# Patient Record
Sex: Male | Born: 1935 | Race: White | Hispanic: No | Marital: Married | Smoking: Former smoker
Health system: Southern US, Community
[De-identification: ages and names within clinical notes are randomized; demographics above are authoritative.]

## PROBLEM LIST (undated history)

## (undated) DIAGNOSIS — R413 Other amnesia: Secondary | ICD-10-CM

## (undated) DIAGNOSIS — R269 Unspecified abnormalities of gait and mobility: Secondary | ICD-10-CM

## (undated) DIAGNOSIS — J189 Pneumonia, unspecified organism: Secondary | ICD-10-CM

## (undated) DIAGNOSIS — R251 Tremor, unspecified: Secondary | ICD-10-CM

## (undated) DIAGNOSIS — G252 Other specified forms of tremor: Secondary | ICD-10-CM

## (undated) DIAGNOSIS — E7800 Pure hypercholesterolemia, unspecified: Secondary | ICD-10-CM

## (undated) DIAGNOSIS — R6889 Other general symptoms and signs: Secondary | ICD-10-CM

## (undated) DIAGNOSIS — H9193 Unspecified hearing loss, bilateral: Secondary | ICD-10-CM

## (undated) DIAGNOSIS — N400 Enlarged prostate without lower urinary tract symptoms: Secondary | ICD-10-CM

## (undated) DIAGNOSIS — I10 Essential (primary) hypertension: Secondary | ICD-10-CM

## (undated) DIAGNOSIS — K5790 Diverticulosis of intestine, part unspecified, without perforation or abscess without bleeding: Secondary | ICD-10-CM

## (undated) DIAGNOSIS — IMO0001 Reserved for inherently not codable concepts without codable children: Secondary | ICD-10-CM

## (undated) DIAGNOSIS — G250 Essential tremor: Secondary | ICD-10-CM

## (undated) DIAGNOSIS — K219 Gastro-esophageal reflux disease without esophagitis: Secondary | ICD-10-CM

## (undated) DIAGNOSIS — D518 Other vitamin B12 deficiency anemias: Secondary | ICD-10-CM

## (undated) DIAGNOSIS — H3530 Unspecified macular degeneration: Secondary | ICD-10-CM

## (undated) HISTORY — DX: Gastro-esophageal reflux disease without esophagitis: K21.9

## (undated) HISTORY — DX: Unspecified abnormalities of gait and mobility: R26.9

## (undated) HISTORY — PX: EYE SURGERY: SHX253

## (undated) HISTORY — PX: TONSILLECTOMY: SUR1361

## (undated) HISTORY — DX: Other specified forms of tremor: G25.0

## (undated) HISTORY — DX: Unspecified macular degeneration: H35.30

## (undated) HISTORY — DX: Diverticulosis of intestine, part unspecified, without perforation or abscess without bleeding: K57.90

## (undated) HISTORY — DX: Reserved for inherently not codable concepts without codable children: IMO0001

## (undated) HISTORY — DX: Other vitamin B12 deficiency anemias: D51.8

## (undated) HISTORY — DX: Benign prostatic hyperplasia without lower urinary tract symptoms: N40.0

## (undated) HISTORY — DX: Other amnesia: R41.3

## (undated) HISTORY — PX: LEG SURGERY: SHX1003

## (undated) HISTORY — DX: Other general symptoms and signs: R68.89

## (undated) HISTORY — DX: Tremor, unspecified: R25.1

## (undated) HISTORY — DX: Essential tremor: G25.2

## (undated) HISTORY — DX: Unspecified hearing loss, bilateral: H91.93

---

## 2002-09-14 ENCOUNTER — Ambulatory Visit (HOSPITAL_COMMUNITY): Admission: RE | Admit: 2002-09-14 | Discharge: 2002-09-14 | Payer: Self-pay | Admitting: Internal Medicine

## 2004-07-06 ENCOUNTER — Ambulatory Visit (HOSPITAL_COMMUNITY): Admission: RE | Admit: 2004-07-06 | Discharge: 2004-07-06 | Payer: Self-pay | Admitting: Colon and Rectal Surgery

## 2006-08-30 ENCOUNTER — Ambulatory Visit: Payer: Self-pay | Admitting: Ophthalmology

## 2006-09-06 ENCOUNTER — Ambulatory Visit: Payer: Self-pay | Admitting: Ophthalmology

## 2008-02-01 ENCOUNTER — Inpatient Hospital Stay (HOSPITAL_COMMUNITY): Admission: AD | Admit: 2008-02-01 | Discharge: 2008-02-05 | Payer: Self-pay | Admitting: Family Medicine

## 2008-02-14 ENCOUNTER — Encounter: Payer: Self-pay | Admitting: Internal Medicine

## 2008-03-05 ENCOUNTER — Encounter: Payer: Self-pay | Admitting: Internal Medicine

## 2008-03-06 ENCOUNTER — Ambulatory Visit (HOSPITAL_COMMUNITY): Admission: RE | Admit: 2008-03-06 | Discharge: 2008-03-06 | Payer: Self-pay | Admitting: Family Medicine

## 2008-03-13 ENCOUNTER — Encounter: Payer: Self-pay | Admitting: Internal Medicine

## 2008-03-28 ENCOUNTER — Ambulatory Visit (HOSPITAL_COMMUNITY): Admission: RE | Admit: 2008-03-28 | Discharge: 2008-03-28 | Payer: Self-pay | Admitting: Gastroenterology

## 2008-03-28 ENCOUNTER — Encounter: Payer: Self-pay | Admitting: Gastroenterology

## 2008-04-02 ENCOUNTER — Ambulatory Visit: Payer: Self-pay | Admitting: Gastroenterology

## 2008-07-28 ENCOUNTER — Emergency Department (HOSPITAL_COMMUNITY): Admission: EM | Admit: 2008-07-28 | Discharge: 2008-07-28 | Payer: Self-pay | Admitting: Emergency Medicine

## 2009-09-01 ENCOUNTER — Encounter: Admission: RE | Admit: 2009-09-01 | Discharge: 2009-09-01 | Payer: Self-pay | Admitting: Neurology

## 2009-09-14 IMAGING — CT CT ABDOMEN WO/W CM
2 of 4 series · 14 of 32 positions shown, 19 images · IV contrast (agent unspecified)
Comparison: 02/02/2008 abdominal CT.

CLINICAL DATA: Follow up pancreatitis.  Diabetes.

CT ABDOMEN WITHOUT AND WITH CONTRAST
TECHNIQUE: Multidetector CT imaging of the abdomen was performed
following the standard protocol before and during bolus
administration of intravenous contrast.
Contrast: 100 ml Imnipaque-3LL.  Oral contrast was given.

[Series 2: arterial 20 sec 3.0 b40f · axial · arterial · 0.71mm/px · z∈[-220,-16]mm · 7 of 92 slices shown]
[im 12/92  soft-tissue]
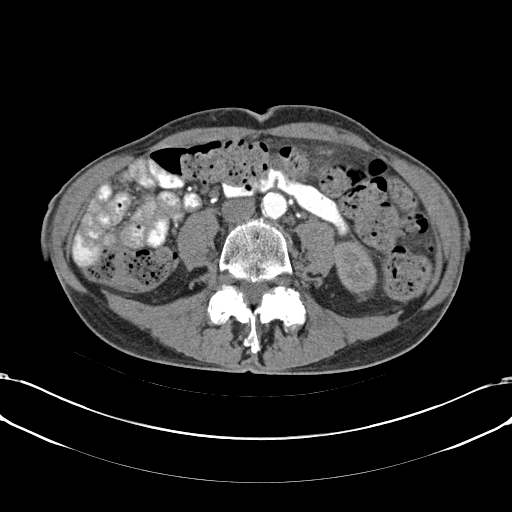
[im 23/92  soft-tissue]
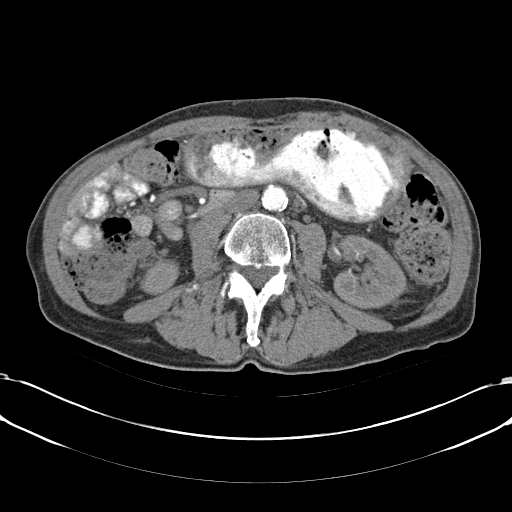
[im 35/92  soft-tissue]
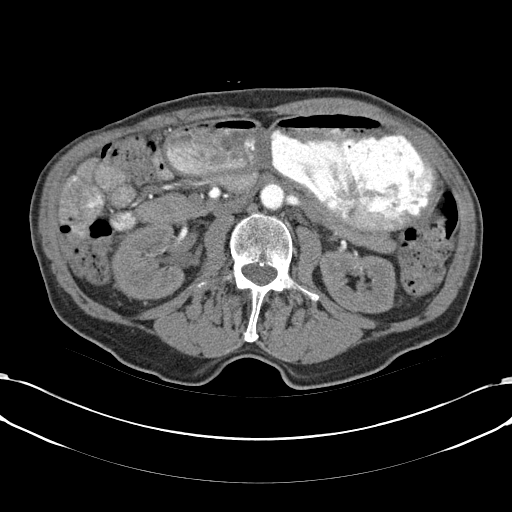
[im 46/92  soft-tissue]
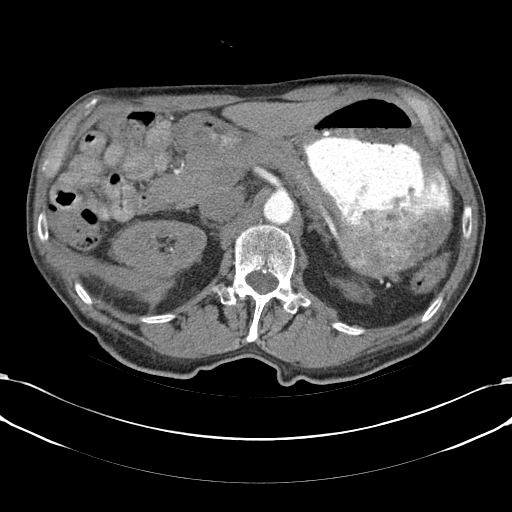
[im 57/92  soft-tissue]
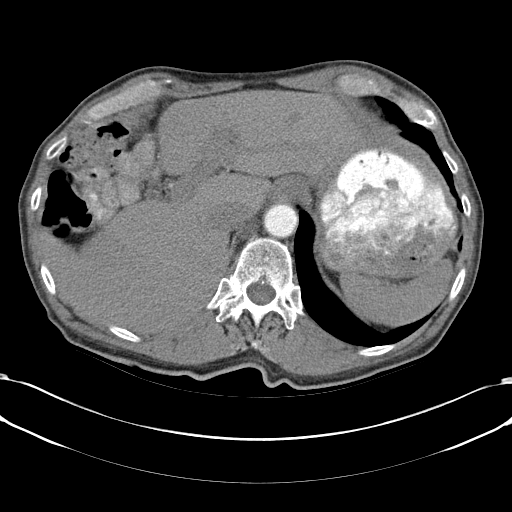
[im 69/92  soft-tissue]
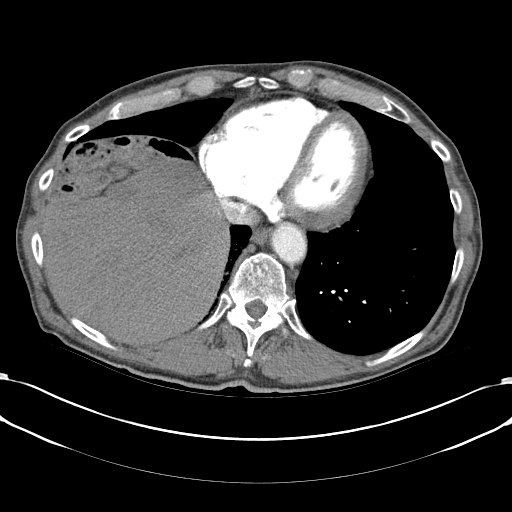
[im 80/92  soft-tissue]
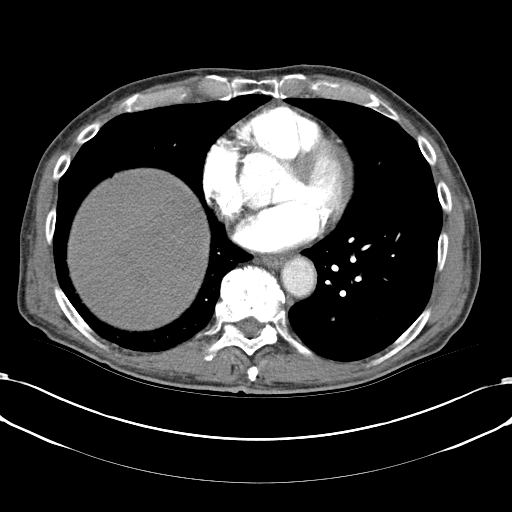

[Series 3: venous 60 sec 3.0 b40f · axial · portal-venous · 0.71mm/px · z∈[-220,-16]mm · 7 of 92 slices shown, 12 images]
[im 12/92  soft-tissue]
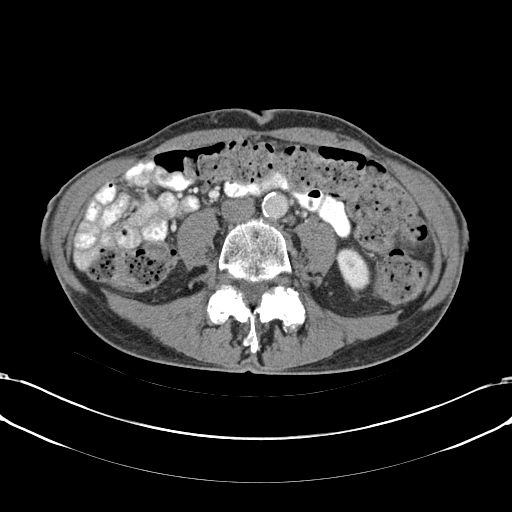
[im 12/92  bone]
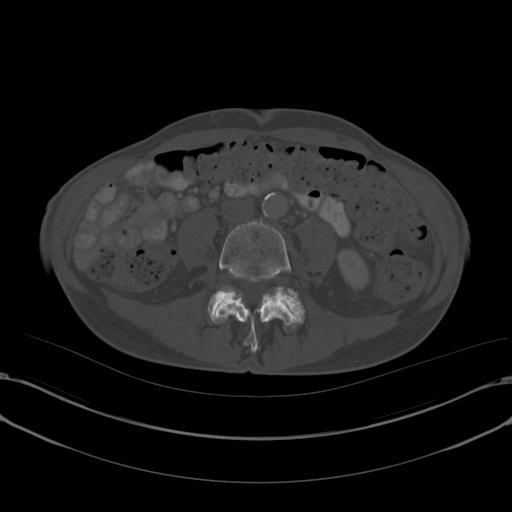
[im 23/92  soft-tissue]
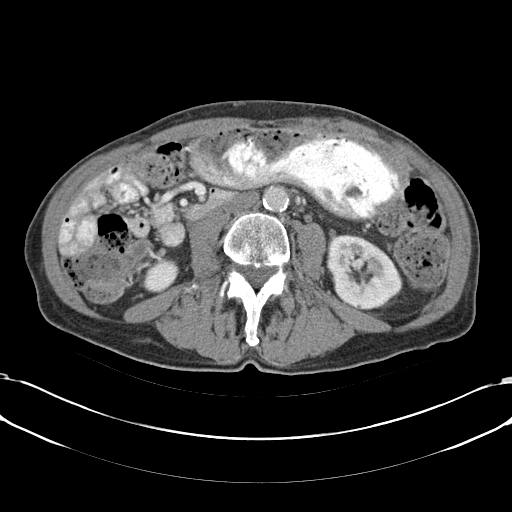
[im 35/92  soft-tissue]
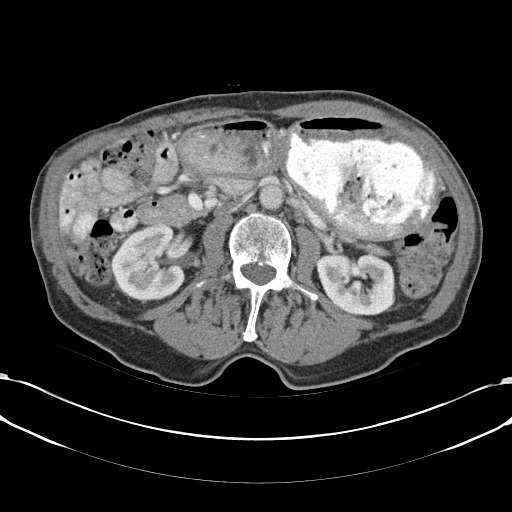
[im 46/92  soft-tissue]
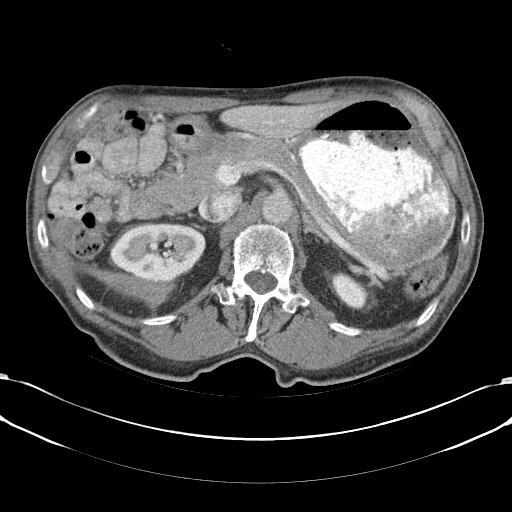
[im 46/92  lung]
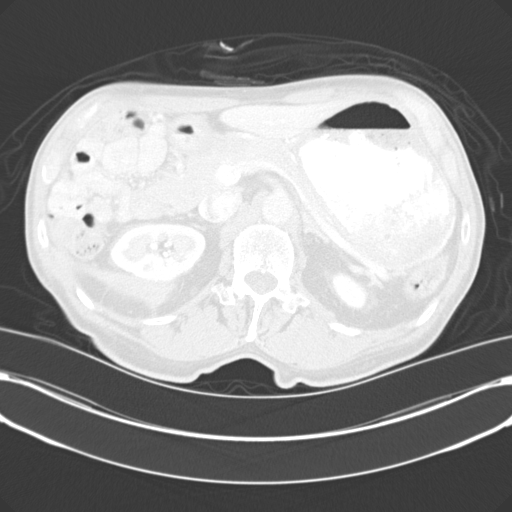
[im 57/92  soft-tissue]
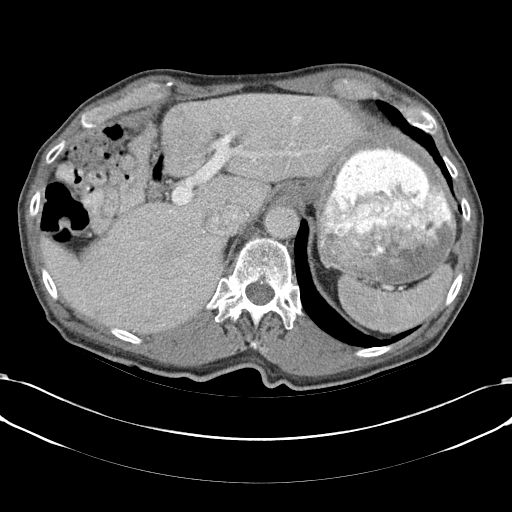
[im 57/92  lung]
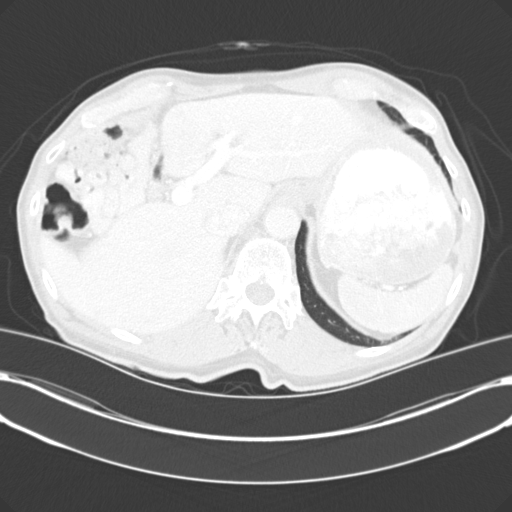
[im 69/92  soft-tissue]
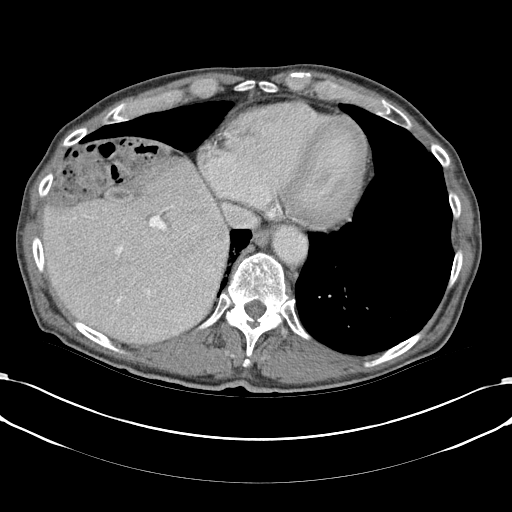
[im 69/92  lung]
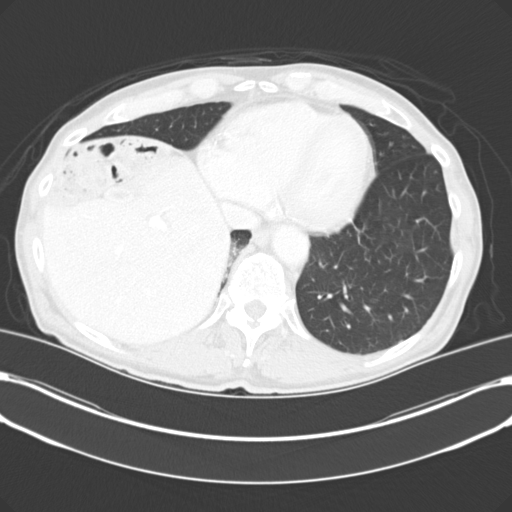
[im 80/92  soft-tissue]
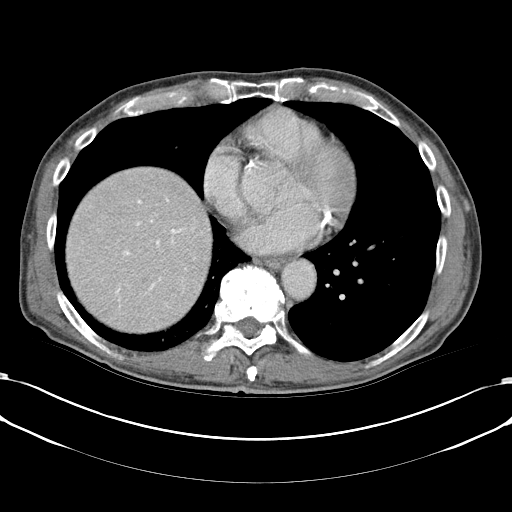
[im 80/92  lung]
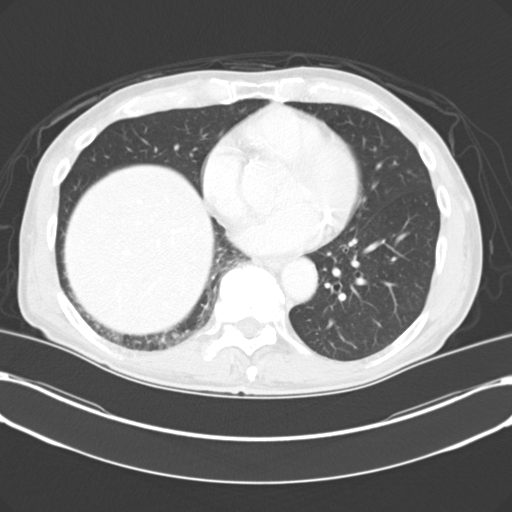

[14 of 32 positions shown; findings below may reference images not displayed]

FINDINGS: The lung bases are clear and there is no pleural
effusion.  Coronary artery calcifications are present.  As noted
previously, there is elevation or eventration of the right
hemidiaphragm with colonic interposition between the liver and the
diaphragm anteriorly.  There is mild stranding of the perinephric
fat bilaterally.  This appears to be in part due to a remnant of
the diaphragm base on the reformatted images.  No residual
peripancreatic inflammation is seen.  The pancreas demonstrates
normal enhancement and no focal abnormality.  There is no fluid
collection.  The splenic and portal veins remain patent.

The kidneys appear stable with a cyst in the lower pole of the
right kidney.  The spleen and adrenal glands appear unremarkable.
No focal liver lesions are identified.  There are no enlarged lymph
nodes. Small anteriorly positioned gallbladder demonstrates no
definite inflammation.
IMPRESSION: 1.  No acute abdominal findings.
2.  Suspected pancreatitis on the prior examination appears to have
resolved.  No abdominal fluid collections are seen.
3.  Stable right renal cyst and elevation or eventration of the
right hemidiaphragm.

## 2010-04-02 ENCOUNTER — Encounter (HOSPITAL_COMMUNITY): Admission: RE | Admit: 2010-04-02 | Discharge: 2010-05-02 | Payer: Self-pay | Admitting: Family Medicine

## 2010-11-29 ENCOUNTER — Encounter: Payer: Self-pay | Admitting: Family Medicine

## 2011-03-17 ENCOUNTER — Other Ambulatory Visit: Payer: Self-pay | Admitting: Family Medicine

## 2011-03-17 DIAGNOSIS — R42 Dizziness and giddiness: Secondary | ICD-10-CM

## 2011-03-19 ENCOUNTER — Ambulatory Visit (HOSPITAL_COMMUNITY)
Admission: RE | Admit: 2011-03-19 | Discharge: 2011-03-19 | Disposition: A | Discharge status: Home / Self Care | Payer: Medicare Other | Source: Ambulatory Visit | Attending: Family Medicine | Admitting: Family Medicine

## 2011-03-19 DIAGNOSIS — R42 Dizziness and giddiness: Secondary | ICD-10-CM | POA: Insufficient documentation

## 2011-03-23 NOTE — Group Therapy Note (Signed)
NAME:  Bradley Reed, Bradley Reed NO.:  [id number]   MEDICAL RECORD NO.:  [id number]          PATIENT TYPE:  INP   LOCATION:  A314                          FACILITY:  [facility]   PHYSICIAN:  Gardiner Barefoot, MD    DATE OF BIRTH:  1924/08/28   DATE OF PROCEDURE:  DATE OF DISCHARGE:                                 PROGRESS NOTE   SUBJECTIVE:  No events overnight and the patient continues to have  improvement and is actually anxious to get out of bed and walk around.  He tolerated clear liquid diet well with no difficulties.   OBJECTIVE:  VITAL SIGNS:  Temperature is 97.5, pulse 62, respirations  20, blood pressure 126/77.  GENERAL:  The patient is awake, alert and oriented x3 and appears in no  acute distress.  CARDIOVASCULAR:  Regular rate and rhythm with systolic ejection murmur.  LUNGS:  Clear to auscultation bilaterally.  ABDOMEN:  Soft, nontender, nondistended and positive bowel sounds and no  hepatosplenomegaly.  EXTREMITIES:  No cyanosis, clubbing or edema.   LABORATORY DATA:  From yesterday include lipase of 57 with mildly  decreased sodium and glucose of 64 otherwise normal, amylase elevated a  little at 166.   ASSESSMENT/PLAN:  1. Pancreatitis.  The patient will have his diet advanced further      today to carb modified and have his IV fluids stopped.  If the      patient does not tolerate any of his food or begins to have any      nausea, vomiting will restart his IV fluids.  Patient is to get up      out of bed, walk around.  Will again evaluate his triglyceride      levels in the a.m. tomorrow.  2. Constipation. The patient is receiving Senokot and is hopeful with      walking around and advancing his diet will be able to relieve his      constipation.      Gardiner Barefoot, MD  Electronically Signed     RWC/MEDQ  D:  02/04/2008  T:  02/05/2008  Job:  [id number]

## 2011-03-23 NOTE — Group Therapy Note (Signed)
NAME:  Bradley Reed, Bradley Reed NO.:  [id number]   MEDICAL RECORD NO.:  [id number]          PATIENT TYPE:  INP   LOCATION:  A314                          FACILITY:  [facility]   PHYSICIAN:  Gardiner Barefoot, MD    DATE OF BIRTH:  12-14-1923   DATE OF PROCEDURE:  02/03/2008  DATE OF DISCHARGE:                                 PROGRESS NOTE   SUBJECTIVE:  Overnight no acute events.  The patient does report that he  feels much better.  The patient has tolerated sips of water and has  had no fever or any other difficulties.   OBJECTIVE:  VITAL SIGNS:  Temperature 97.5, pulse 74, respirations 20,  blood pressure 147/83, and O2 saturations 98%.  GENERAL:  The patient is awake, alert, and oriented x3 and appears in no  acute distress.  CARDIOVASCULAR:  Regular rate and rhythm with a 3/6 systolic ejection  murmur.  LUNGS:  Are clear to auscultation bilaterally.  ABDOMEN:  Is soft, nontender and nondistended.  Positive bowel sounds  and no hepatosplenomegaly.  EXTREMITIES:  No cyanosis, clubbing, or edema.   LABORATORY DATA:  No new labs this a.m.   ASSESSMENT/PLAN:  Pancreatitis.  Unclear etiology of his pancreatitis.  CT and ultrasound do not suggest any and gallbladder etiology.  The  patient does report daily alcohol use.  However, from what he describes  fairly minimal amount.  This would unlikely be the precipitant for this  pancreatitis.  However, certainly the patient was counseled to stop  drinking at this time since he has had this episode, so it does not  exacerbate it.  Other etiologies would be triglycerides, which I do not  see at this time any recent triglycerides.  However, he is on a statin  to suggest some elevation of cholesterol.  Will check his fasting lipids  to rule out the triglycerides as the etiology.  Otherwise will advance  his diet today to clear liquids and see if he tolerates that and did  discuss the patient to go slowly.  Will continue the IV fluids  for now  while he does advance the diet.  CT scan does not suggest any need for  antibiotics.  Will follow his white count.      Gardiner Barefoot, MD  Electronically Signed     RWC/MEDQ  D:  02/03/2008  T:  02/03/2008  Job:  [id number]

## 2011-03-23 NOTE — H&P (Signed)
NAME:  Bradley Reed, Bradley Reed             ACCOUNT NO.:  [id number]   MEDICAL RECORD NO.:  [id number]          PATIENT TYPE:  INP   LOCATION:  A314                          FACILITY:  [facility]   PHYSICIAN:  Scott A. Gerda Diss, MD    DATE OF BIRTH:  12-14-1923   DATE OF ADMISSION:  02/01/2008  DATE OF DISCHARGE:  LH                              HISTORY & PHYSICAL   CHIEF COMPLAINT:  Abdominal pain.   HISTORY OF PRESENT ILLNESS:  This is an 77 year old white male who  related having right upper quadrant discomfort that started about 3 days  ago, intermittent pain.  The pain comes and goes, sometimes in the  flank and sometimes in the abdomen. He could not really tell if it was  rib or a pulled  muscle.  He really does not think it is a pulled muscle  though because the pain can be the same whether he is  doing something  or staying still.  It is bad enough to where it kept him awake during  the night.  He relates nausea with them but no vomiting.  He denies  diarrhea.  Denies fever, chills, sweats, denies chest tightness,  shortness of breath, cough, sore throat, headache.   PAST MEDICAL HISTORY:  1. Enlarged prostate.  2. Diverticulosis.  3. Type 2 diabetes.  4. Glaucoma.  5. Hyperlipidemia.  6. Hypertension.  7. Macular degeneration disease.  8. Reflux.  9. Hearing impairment.  10.History of fractured leg.   FAMILY HISTORY:  Hypertension, heart disease and cholesterol.   SOCIAL HISTORY:  Married.  Does not smoke.   MEDICATIONS:  1. Pravachol 80 mg daily.  2. Prinivil 20 mg daily.  3. Vitamins daily.  4. Baby aspirin 81 mg daily.  5. Lumigan drops in the eyes daily.  6. Glucophage 1000 mg b.i.d. daily.  7. Vitamin B12 p.o. daily.  8. Prilosec OTC daily.  9. Glucosamine b.i.d. p.r.n.  10.Eye supplement daily.  11.MiraLax on a p.r.n. basis.   REVIEW OF SYSTEMS:  See per above.   PHYSICAL EXAMINATION:  GENERAL:  He is pleasant but he does state that  he is having some  discomfort.  VITAL SIGNS:  Blood pressure 142/84.  TMs __________.  NECK:  No masses.  CHEST:  CTA.  No crackles.  RNL.  HEART:  Regular.  ABDOMEN:  Abdomen is soft.  No guarding or rebound.  It should be noted  when percussing of right flank, he did have some tenderness.  It should  also be noted that when pressing deep in the right upper quadrant, there  was some mild tenderness.  Minimal epigastric tenderness should also be  noted.  EXTREMITIES:  No edema.  SKIN: Warm, dry.  RECTAL:  Prostate exam was normal, nontender.   LABORATORY DATA:  Also labs were done as an outpatient and those showed  a normal white count of 7000, hemoglobin 14.2.  Glucose 103, potassium  4, sodium 136, bilirubin 1.1, AST 23, ALT 14 and an amylase of 233.   ASSESSMENT/PLAN:  Pancreatitis.  There is no obvious underlying risk  factors for this  pancreatitis. It seems to have come out of the blue.  I  think we need to get an ultrasound  to look at the possibility of a  gallstone, to look at the common bile duct and, hopefully, the  pancreatic head as well.  Repeat labs in the morning.  If he is still  having discomfort and the ultrasound is negative, consider doing a CT  scan.  If he has ongoing troubles, consider a HIDA scan.  I do not feel  an EGD is indicated right at the moment.  I do not feel antibiotics are  indicated currently.  We will monitor the patient closely and follow up  in the morning.      Scott A. Gerda Diss, MD  Electronically Signed     SAL/MEDQ  D:  02/01/2008  T:  02/02/2008  Job:  [id number]

## 2011-03-23 NOTE — Discharge Summary (Signed)
NAME:  Bradley Reed, Bradley Reed NO.:  [id number]   MEDICAL RECORD NO.:  [id number]          PATIENT TYPE:  INP   LOCATION:  A314                          FACILITY:  [facility]   PHYSICIAN:  Donna Bernard, M.D.DATE OF BIRTH:  [birth year]   DATE OF ADMISSION:  02/01/2008  DATE OF DISCHARGE:  03/30/2009LH                               DISCHARGE SUMMARY   FINAL DIAGNOSES:  1. Pancreatitis.  2. Type 2 diabetes.  3. Hyperlipidemia.  4. Hypertension.  5. Insomnia.   FINAL DISPOSITION:  1. The patient discharged to home.  2. Discharge medications:  Maintain all chronic meds plus Vicodin 1      q.4-6 hours p.r.n. for pain, Reglan 5 mg q.4-6 p.r.n. for nausea.  3. Low-fat diet this next week.  4. Report any worsening in the abdominal pain or occurrence of      vomiting.  5. Follow up in the office as scheduled.   INITIAL HISTORY AND PHYSICAL:  Please see H&P as dictated.   HOSPITAL COURSE:  This patient is an 77 year old white male with a  longstanding history of type 2 diabetes, hyperlipidemia, and  hypertension who presented to the office with a several-day history of  abdominal pain, primarily in the epigastric right upper quadrant area  radiating to the flank.  At times, it was severe and it was associated  with nausea.  There was no vomiting.  Abdominal exam at that point  revealed some tenderness in the right upper quadrant.  Amylase was 233.  Liver enzymes were normal.  The patient was admitted to the hospital,  started on IV fluids, IV pain medicine, and Zofran.  Ultrasound was  normal, and CT of the pancreas showed some swelling in the head of the  pancreas.  The patient's amylase and lipase dropped down to normal on IV  fluids.  His symptoms improved markedly.  His diet was advanced a day  prior to discharge, and he handled this well.  On the day of discharge,  the patient had benign exam and was discharged home.   DIAGNOSIS AND DISPOSITION:  As noted  above.      Donna Bernard, M.D.  Electronically Signed    WSL/MEDQ  D:  03/06/2008  T:  03/07/2008  Job:  [id number]

## 2011-03-26 NOTE — Op Note (Signed)
NAME:  Bradley Reed, Bradley Reed                       ACCOUNT NO.:  [id number]   MEDICAL RECORD NO.:  [id number]                   PATIENT TYPE:  AMB   LOCATION:  DAY                                  FACILITY:  [facility]   PHYSICIAN:  R. Roetta Sessions, M.D.              DATE OF BIRTH:  1923/11/20   DATE OF PROCEDURE:  09/14/2002  DATE OF DISCHARGE:                                 OPERATIVE REPORT   PROCEDURE:  Screening colonoscopy.   INDICATION FOR PROCEDURE:  The patient is a 77 year old gentleman sent over  out of the courtesy of W. Simone Curia, M.D., for colorectal cancer  screening.  He is devoid of any lower GI tract symptoms.  He has had  sigmoidoscopies in the past.  The last was several years ago, and no  significant findings were noted.  Colonoscopy is now being done as a  standard screening maneuver.  There is no significant family history of  colorectal neoplasia.  ASA-2.   DESCRIPTION OF PROCEDURE:  O2 saturation, blood pressure, pulse, and  respirations were monitored throughout the entire procedure.  Conscious  sedation:  Versed 3 mg IV, Demerol 50 mg IV in divided doses.  Instrument:  Olympus video chip adult colonoscope.   FINDINGS:  Digital rectal exam revealed no abnormalities.   Endoscopic findings:  Prep was good.   Rectum:  Examination of the rectal mucosa, including a retroflex view of the  anal verge, revealed no abnormalities.   Colon:  The colonic mucosa was surveyed from the rectosigmoid junction  through the left, transverse, and right colon to the area of the appendiceal  orifice, ileocecal valve, and cecum.  These structures were well-seen and  photographed for the record.  There was a 3 mm polyp on the ileocecal valve,  which was cold biopsied/removed.  The terminal ileum was intubated 10 cm,  and this segment of the GI tract was also normal.  From the level of the  cecum and the ileocecal valve, the scope was slowly and cautiously  withdrawn.  All  previously mentioned mucosal surfaces were again seen, and  no other abnormalities were observed.  The patient tolerated the procedure  well, was reactive in endoscopy.   IMPRESSION:  1. Normal rectum.  2. Normal colon except for 3 mm polyp at the ileocecal valve, cold     biopsied/removed.  The terminal ileum appeared normal.    RECOMMENDATIONS:  1. Follow up on pathology.  2. Further recommendations to follow.                                               Jonathon Bellows, M.D.    RMR/MEDQ  D:  09/14/2002  T:  09/14/2002  Job:  [id number]   cc:   Donna Bernard, M.D.  [place] [address]  Fax: [phone]

## 2011-06-01 ENCOUNTER — Emergency Department (HOSPITAL_COMMUNITY)
Admission: EM | Admit: 2011-06-01 | Discharge: 2011-06-01 | Disposition: A | Discharge status: Home / Self Care | Payer: Medicare Other | Attending: Emergency Medicine | Admitting: Emergency Medicine

## 2011-06-01 DIAGNOSIS — Z87891 Personal history of nicotine dependence: Secondary | ICD-10-CM | POA: Insufficient documentation

## 2011-06-01 DIAGNOSIS — Y9289 Other specified places as the place of occurrence of the external cause: Secondary | ICD-10-CM | POA: Insufficient documentation

## 2011-06-01 DIAGNOSIS — IMO0002 Reserved for concepts with insufficient information to code with codable children: Secondary | ICD-10-CM | POA: Insufficient documentation

## 2011-06-01 DIAGNOSIS — E119 Type 2 diabetes mellitus without complications: Secondary | ICD-10-CM | POA: Insufficient documentation

## 2011-06-01 DIAGNOSIS — S61419A Laceration without foreign body of unspecified hand, initial encounter: Secondary | ICD-10-CM

## 2011-06-01 DIAGNOSIS — S61409A Unspecified open wound of unspecified hand, initial encounter: Secondary | ICD-10-CM | POA: Insufficient documentation

## 2011-06-01 MED ORDER — TETANUS-DIPHTH-ACELL PERTUSSIS 5-2.5-18.5 LF-MCG/0.5 IM SUSP
0.5000 mL | Freq: Once | INTRAMUSCULAR | Status: AC
  Administered 2011-06-01: 0.5 mL via INTRAMUSCULAR
  Filled 2011-06-01: qty 0.5

## 2011-06-01 NOTE — ED Notes (Signed)
Pt hit left hand on a table on a train yesterday.  Pt has small skin tear and bruise to area.

## 2011-06-01 NOTE — Discharge Instructions (Signed)
Laceration Care A laceration is a cut or lesion that goes through all layers of the skin and into the tissue just beneath the skin.  BEFORE THE PROCEDURE The laceration will be inspected by your caregiver for amount and extent of tissue damage, for bleeding, for evidence of foreign bodies (pieces of dirt, glass, etc.), and for cleanliness. Pain medications can be used if necessary. The wound will then be cleansed to help prevent infection. Sutures, staples or skin adhesive strips will be used to close the wound, stop bleeding and speed healing. Sometimes this will decrease the likelihood of infection and bleeding. LET YOUR CAREGIVERS KNOW ABOUT THE FOLLOWING:  Allergies.  Medications taken including herbs, eye drops, over the counter medications, and creams.   Use of steroids (by mouth or creams).   Previous problems with anesthetics or novocaine.   Possibility of pregnancy, if this applies.  History of blood clots (thrombophlebitis).   History of bleeding or blood problems.   Previous surgery.   Other health problems.   RISKS AND COMPLICATIONS Most lacerations heal fully. The healing time required varies depending on location. Complications of a laceration can include pain, bleeding, infection, dehiscence (splitting open or separation of the wound edges) and scar formation. The likelihood of complication depends on wound complexity, location, and on how the laceration occurred. HOME CARE INSTRUCTIONS  If you were given a dressing, you should change it at least once a day, or as instructed by your caregiver. If the bandage sticks, soak it off with a solution of hydrogen peroxide or soapy water.   Twice a day, wash the area with soap and water and rinse with plain water to remove all soap. Pat (do not rub) dry with a clean towel. Look for signs of infection (see below).   Re-apply prescribed creams or ointments as instructed. This will help prevent infection. This also helps keep the  bandage from sticking.   If the bandage becomes wet, dirty, or develops a foul smell, change it as soon as possible.   Only take over-the-counter or prescription medicines for pain, discomfort, or fever as directed by your caregiver.   Have your sutures, staples or skin adhesive strips removed as instructed. Skin adhesive strips will peel off from the outer edges toward the center and will eventually fall off. Report to your caregiver if the strips are falling off and the wound does not appear fully healed.  SEEK MEDICAL CARE IF:  There is redness, swelling, or increasing pain in the wound.   There is a red line that goes up your arm or leg.   Pus is coming from wound.   You develop an unexplained oral temperature above 101, or as your caregiver suggests.   You notice a foul smell coming from the wound or dressing.   There is a breaking open of the wound (edges not staying together) before or after sutures have been removed.   You notice something coming out of the wound such as wood or glass.   The wound is on your hand or foot and you find that you are unable to properly move a finger or toe.   There is severe swelling around the wound causing pain and numbness or a change in color in your arm, hand, leg, or foot.  SEEK IMMEDIATE MEDICAL CARE IF:  Pain is not controlled with prescribed medication or with acetaminophen or ibuprofen.   There is severe swelling around the wound site.   The wound splits open and  bleeding recurs.   You experience worsening numbness, weakness, or loss of function of any joint around or beyond the wound.   You develop painful lumps near the wound or on the skin anywhere on your body.  If you did not receive a tetanus shot today because you did not recall when your last one was given, make sure to check with your caregiver when you have your sutures removed to determine if you need one. MAKE SURE YOU:   Understand these instructions.   Will watch  your condition.   Will get help right away if you are not doing well or get worse.  Document Released: 10/25/2005 Document Re-Released: 04/14/2010 [facility] Patient Information 2011 [place], [place].

## 2011-06-01 NOTE — ED Notes (Signed)
Skin tear cleansed with SAF-Clens AF, steri-strips applied and a sterile dressing.  Pt tolerated well.

## 2011-06-01 NOTE — ED Provider Notes (Signed)
History     Chief Complaint  Patient presents with  . Hand Injury   HPI Comments: Patient c/o small skin tear to left hand that occurred yesterday when he accidentally struck his hand on a metal tray while riding on a train.  He denies numbness, weakness or decreased ROM of the hand.  Also denies other injuries  Patient is a 76 y.o. male presenting with hand injury. The history is provided by the patient.  Hand Injury  The incident occurred yesterday. Incident location: on a train. The injury mechanism was a direct blow. The pain is present in the left hand. The pain is at a severity of 0/10. The patient is experiencing no pain. Pertinent negatives include no fever and no malaise/fatigue. He reports no foreign bodies present. The symptoms are aggravated by use. He has tried nothing for the symptoms. The treatment provided no relief.    Past Medical History  Diagnosis Date  . Diabetes mellitus     Past Surgical History  Procedure Date  . Leg surgery     History reviewed. No pertinent family history.  History  Substance Use Topics  . Smoking status: Former Games developer  . Smokeless tobacco: Not on file  . Alcohol Use: Not on file      Review of Systems  Constitutional: Negative for fever and malaise/fatigue.  HENT: Negative for neck pain and neck stiffness.   Respiratory: Negative.   Cardiovascular: Negative.   Musculoskeletal: Negative for arthralgias.  Skin: Positive for wound.  Neurological: Negative for dizziness, weakness, numbness and headaches.  Hematological: Does not bruise/bleed easily.  Psychiatric/Behavioral: Negative for behavioral problems.    Physical Exam  BP 144/84  Pulse 72  Temp(Src) 97.8 F (36.6 C) (Oral)  Resp 18  Ht 5\' 9"  (1.753 m)  Wt 144 lb (65.318 kg)  BMI 21.27 kg/m2  SpO2 95%  Physical Exam  Nursing note and vitals reviewed. Constitutional: He is oriented to person, place, and time. He appears well-developed and well-nourished. No  distress.  HENT:  Head: Normocephalic and atraumatic.  Neck: Normal range of motion. Neck supple.  Cardiovascular: Normal rate, regular rhythm and normal heart sounds.   Pulmonary/Chest: Effort normal and breath sounds normal.  Musculoskeletal: He exhibits tenderness. He exhibits no edema.       Hands: Neurological: He is alert and oriented to person, place, and time. He exhibits normal muscle tone. Coordination normal.  Skin: Skin is warm and dry.    ED Course  Procedures  MDM  2 cm superficial flap type lac to the dorsal left hand at the proximal thumb.  Wound was cleaned, steristrips applied,  and bandaged by the nursing staff. Pt tolerated well.  He was advised to return for any signs of infection.  Pt is left hand dominant.  No muscle or tendon injury seen.  Distal sensation intact and CR<2 sec  Gertha Lichtenberg L. [place], [place] 06/07/11 [id number]

## 2011-06-06 NOTE — ED Provider Notes (Signed)
Medical screening examination/treatment/procedure(s) were performed by non-physician practitioner and as supervising physician I was immediately available for consultation/collaboration.   Nelia Shi, MD 06/06/11 [phone]

## 2011-06-09 NOTE — ED Provider Notes (Signed)
Medical screening examination/treatment/procedure(s) were performed by non-physician practitioner and as supervising physician I was immediately available for consultation/collaboration.   Nelia Shi, MD 06/09/11 [phone]

## 2011-08-02 LAB — DIFFERENTIAL
Basophils Absolute: 0.1
Basophils Absolute: 0.1
Basophils Relative: 1
Basophils Relative: 2 — ABNORMAL HIGH
Eosinophils Absolute: 0.3
Eosinophils Absolute: 0.3
Eosinophils Relative: 6 — ABNORMAL HIGH
Eosinophils Relative: 6 — ABNORMAL HIGH
Lymphocytes Relative: 19
Lymphs Abs: 1.1
Monocytes Absolute: 0.7
Monocytes Absolute: 0.8
Monocytes Relative: 12
Monocytes Relative: 14 — ABNORMAL HIGH
Neutro Abs: 2.7
Neutro Abs: 3.4
Neutrophils Relative %: 59

## 2011-08-02 LAB — LIPASE, BLOOD
Lipase: 43
Lipase: 57

## 2011-08-02 LAB — CBC
HCT: 36.5 — ABNORMAL LOW
HCT: 41.5
Hemoglobin: 14.4
MCHC: 34.7
MCV: 100.1 — ABNORMAL HIGH
RBC: 3.65 — ABNORMAL LOW
RBC: 4.14 — ABNORMAL LOW
WBC: 5.7

## 2011-08-02 LAB — COMPREHENSIVE METABOLIC PANEL
AST: 23
BUN: 7
CO2: 30
Chloride: 101
Creatinine, Ser: 0.93
GFR calc non Af Amer: >60
Glucose, Bld: 106 — ABNORMAL HIGH
Total Bilirubin: 1

## 2011-08-02 LAB — BASIC METABOLIC PANEL
BUN: 11
CO2: 30
Calcium: 8.8
Creatinine, Ser: 0.91
GFR calc Af Amer: >60
GFR calc Af Amer: >60
GFR calc non Af Amer: >60
Glucose, Bld: 64 — ABNORMAL LOW

## 2011-08-02 LAB — HEPATIC FUNCTION PANEL
Albumin: 3.2 — ABNORMAL LOW
Total Bilirubin: 0.9
Total Protein: 5.8 — ABNORMAL LOW

## 2011-08-02 LAB — LIPID PANEL
Cholesterol: 164
Triglycerides: 61

## 2011-08-02 LAB — AMYLASE: Amylase: 180 — ABNORMAL HIGH

## 2011-08-09 LAB — GLUCOSE, CAPILLARY: Glucose-Capillary: 146 — ABNORMAL HIGH

## 2012-01-20 ENCOUNTER — Emergency Department (HOSPITAL_COMMUNITY): Payer: Medicare Other

## 2012-01-20 ENCOUNTER — Encounter (HOSPITAL_COMMUNITY): Payer: Self-pay

## 2012-01-20 ENCOUNTER — Encounter (HOSPITAL_COMMUNITY): Payer: Self-pay | Admitting: Internal Medicine

## 2012-01-20 ENCOUNTER — Emergency Department (HOSPITAL_COMMUNITY)
Admission: EM | Admit: 2012-01-20 | Discharge: 2012-01-20 | Disposition: A | Discharge status: Home / Self Care | Payer: Medicare Other | Attending: Emergency Medicine | Admitting: Emergency Medicine

## 2012-01-20 ENCOUNTER — Inpatient Hospital Stay (HOSPITAL_COMMUNITY)
Admission: EM | Admit: 2012-01-20 | Discharge: 2012-01-22 | DRG: 689 | Disposition: A | Discharge status: Home / Self Care | Payer: Medicare Other | Attending: Internal Medicine | Admitting: Internal Medicine

## 2012-01-20 DIAGNOSIS — Z7982 Long term (current) use of aspirin: Secondary | ICD-10-CM

## 2012-01-20 DIAGNOSIS — I10 Essential (primary) hypertension: Secondary | ICD-10-CM | POA: Diagnosis present

## 2012-01-20 DIAGNOSIS — E7800 Pure hypercholesterolemia, unspecified: Secondary | ICD-10-CM | POA: Insufficient documentation

## 2012-01-20 DIAGNOSIS — Z79899 Other long term (current) drug therapy: Secondary | ICD-10-CM

## 2012-01-20 DIAGNOSIS — R5381 Other malaise: Secondary | ICD-10-CM | POA: Diagnosis present

## 2012-01-20 DIAGNOSIS — R32 Unspecified urinary incontinence: Secondary | ICD-10-CM | POA: Insufficient documentation

## 2012-01-20 DIAGNOSIS — G92 Toxic encephalopathy: Secondary | ICD-10-CM | POA: Diagnosis present

## 2012-01-20 DIAGNOSIS — H409 Unspecified glaucoma: Secondary | ICD-10-CM | POA: Diagnosis present

## 2012-01-20 DIAGNOSIS — N390 Urinary tract infection, site not specified: Secondary | ICD-10-CM | POA: Insufficient documentation

## 2012-01-20 DIAGNOSIS — E860 Dehydration: Secondary | ICD-10-CM | POA: Diagnosis present

## 2012-01-20 DIAGNOSIS — E119 Type 2 diabetes mellitus without complications: Secondary | ICD-10-CM | POA: Diagnosis present

## 2012-01-20 DIAGNOSIS — R319 Hematuria, unspecified: Secondary | ICD-10-CM | POA: Diagnosis present

## 2012-01-20 DIAGNOSIS — A498 Other bacterial infections of unspecified site: Secondary | ICD-10-CM | POA: Diagnosis present

## 2012-01-20 DIAGNOSIS — G929 Unspecified toxic encephalopathy: Secondary | ICD-10-CM | POA: Diagnosis present

## 2012-01-20 HISTORY — DX: Pure hypercholesterolemia, unspecified: E78.00

## 2012-01-20 HISTORY — DX: Essential (primary) hypertension: I10

## 2012-01-20 LAB — CBC
MCHC: 35.2 g/dL (ref 30.0–36.0)
RDW: 13.5 % (ref 11.5–15.5)

## 2012-01-20 LAB — URINALYSIS, ROUTINE W REFLEX MICROSCOPIC
Nitrite: POSITIVE — AB
Specific Gravity, Urine: 1.01 (ref 1.005–1.030)
Urobilinogen, UA: 0.2 mg/dL (ref 0.0–1.0)

## 2012-01-20 LAB — COMPREHENSIVE METABOLIC PANEL
ALT: 8 U/L (ref 0–53)
Albumin: 3.6 g/dL (ref 3.5–5.2)
Alkaline Phosphatase: 74 U/L (ref 39–117)
BUN: 22 mg/dL (ref 6–23)
Chloride: 98 mEq/L (ref 96–112)
Glucose, Bld: 137 mg/dL — ABNORMAL HIGH (ref 70–99)
Potassium: 3.5 mEq/L (ref 3.5–5.1)
Total Bilirubin: 1 mg/dL (ref 0.3–1.2)

## 2012-01-20 LAB — GLUCOSE, CAPILLARY: Glucose-Capillary: 160 mg/dL — ABNORMAL HIGH (ref 70–99)

## 2012-01-20 LAB — LACTIC ACID, PLASMA: Lactic Acid, Venous: 1.8 mmol/L (ref 0.5–2.2)

## 2012-01-20 LAB — DIFFERENTIAL
Basophils Absolute: 0 10*3/uL (ref 0.0–0.1)
Basophils Relative: 0 % (ref 0–1)
Neutro Abs: 17.1 10*3/uL — ABNORMAL HIGH (ref 1.7–7.7)
Neutrophils Relative %: 93 % — ABNORMAL HIGH (ref 43–77)

## 2012-01-20 MED ORDER — ACETAMINOPHEN 325 MG PO TABS
650.0000 mg | ORAL_TABLET | Freq: Four times a day (QID) | ORAL | Status: DC | PRN
  Administered 2012-01-21: 650 mg via ORAL
  Filled 2012-01-20: qty 2

## 2012-01-20 MED ORDER — DEXTROSE 5 % IV SOLN
1.0000 g | INTRAVENOUS | Status: DC
  Administered 2012-01-21: 1 g via INTRAVENOUS
  Filled 2012-01-20 (×4): qty 10

## 2012-01-20 MED ORDER — SODIUM CHLORIDE 0.9 % IV SOLN
INTRAVENOUS | Status: AC
  Administered 2012-01-20 – 2012-01-21 (×3): via INTRAVENOUS

## 2012-01-20 MED ORDER — DEXTROSE 5 % IV SOLN
1.0000 g | Freq: Once | INTRAVENOUS | Status: AC
  Administered 2012-01-20: 1 g via INTRAVENOUS
  Filled 2012-01-20 (×2): qty 10

## 2012-01-20 MED ORDER — ONDANSETRON HCL 4 MG/2ML IJ SOLN: 4.0000 mg | Freq: Four times a day (QID) | INTRAMUSCULAR | Status: DC | PRN

## 2012-01-20 MED ORDER — ONDANSETRON HCL 4 MG PO TABS: 4.0000 mg | ORAL_TABLET | Freq: Four times a day (QID) | ORAL | Status: DC | PRN

## 2012-01-20 MED ORDER — CIPROFLOXACIN HCL 500 MG PO TABS: 500.0000 mg | ORAL_TABLET | Freq: Two times a day (BID) | ORAL | Status: DC

## 2012-01-20 MED ORDER — BIMATOPROST 0.01 % OP SOLN
1.0000 [drp] | Freq: Every day | OPHTHALMIC | Status: DC
  Filled 2012-01-20: qty 2.5

## 2012-01-20 MED ORDER — ENOXAPARIN SODIUM 40 MG/0.4ML ~~LOC~~ SOLN
40.0000 mg | SUBCUTANEOUS | Status: DC
  Administered 2012-01-21 – 2012-01-22 (×2): 40 mg via SUBCUTANEOUS
  Filled 2012-01-20 (×2): qty 0.4

## 2012-01-20 MED ORDER — SIMVASTATIN 20 MG PO TABS
20.0000 mg | ORAL_TABLET | Freq: Every day | ORAL | Status: DC
  Administered 2012-01-21 – 2012-01-22 (×2): 20 mg via ORAL
  Filled 2012-01-20 (×2): qty 1

## 2012-01-20 MED ORDER — CIPROFLOXACIN HCL 250 MG PO TABS
500.0000 mg | ORAL_TABLET | Freq: Once | ORAL | Status: AC
  Administered 2012-01-20: 500 mg via ORAL
  Filled 2012-01-20: qty 1

## 2012-01-20 MED ORDER — INSULIN ASPART 100 UNIT/ML ~~LOC~~ SOLN
0.0000 [IU] | Freq: Three times a day (TID) | SUBCUTANEOUS | Status: DC
  Administered 2012-01-22: 3 [IU] via SUBCUTANEOUS
  Administered 2012-01-22 (×2): 2 [IU] via SUBCUTANEOUS

## 2012-01-20 MED ORDER — ALBUTEROL SULFATE (5 MG/ML) 0.5% IN NEBU: 2.5000 mg | INHALATION_SOLUTION | RESPIRATORY_TRACT | Status: DC | PRN

## 2012-01-20 MED ORDER — ACETAMINOPHEN 650 MG RE SUPP: 650.0000 mg | Freq: Four times a day (QID) | RECTAL | Status: DC | PRN

## 2012-01-20 MED ORDER — SODIUM CHLORIDE 0.9 % IV SOLN
Freq: Once | INTRAVENOUS | Status: AC
  Administered 2012-01-20: 21:00:00 via INTRAVENOUS

## 2012-01-20 MED ORDER — ASPIRIN EC 81 MG PO TBEC
81.0000 mg | DELAYED_RELEASE_TABLET | Freq: Every day | ORAL | Status: DC
  Administered 2012-01-21 – 2012-01-22 (×2): 81 mg via ORAL
  Filled 2012-01-20 (×2): qty 1

## 2012-01-20 NOTE — ED Notes (Signed)
Pt bladder scanned.  Results 81 ccs.

## 2012-01-20 NOTE — ED Provider Notes (Signed)
History     CSN: [id number]  Arrival date & time 01/20/12  4098   First MD Initiated Contact with Patient 01/20/12 0400      Chief Complaint  Patient presents with  . Urinary Incontinence    (Consider location/radiation/quality/duration/timing/severity/associated sxs/prior treatment) HPI Comments: 78 y/o male with hx of DM, htn, who presents with c/o of urinary incontinence over the last 12 hours - states that at 5:30 he was trying to pick up firewood and accidentally urinated on self - since that time he has been unable to control his urination.  Sx are persistent, he has no nausea or fever and has mild SP pain.  He denies hx of urinary sx and has never had catheter.  Nothing makes better or worse.  The history is provided by the patient and a relative.    Past Medical History  Diagnosis Date  . Diabetes mellitus   . Hypertension   . Hypercholesteremia   . Glaucoma     Past Surgical History  Procedure Date  . Leg surgery   . Tonsillectomy     No family history on file.  History  Substance Use Topics  . Smoking status: Former Games developer  . Smokeless tobacco: Not on file  . Alcohol Use: Not on file      Review of Systems  Constitutional: Negative for fever and chills.  HENT: Negative for neck pain.   Respiratory: Negative for cough and shortness of breath.   Cardiovascular: Negative for chest pain and leg swelling.  Gastrointestinal: Positive for abdominal pain. Negative for nausea, vomiting, diarrhea, constipation, blood in stool and abdominal distention.  Genitourinary: Positive for difficulty urinating. Negative for hematuria, penile swelling, scrotal swelling, penile pain and testicular pain.  Skin: Negative for rash.  Neurological: Negative for headaches.    Allergies  Review of patient's allergies indicates no known allergies.  Home Medications   Current Outpatient Rx  Name Route Sig Dispense Refill  . VITAMIN C CR PO Oral Take 700 Int'l Units by mouth  2 (two) times daily.      . ASPIRIN 81 MG PO TABS Oral Take 81 mg by mouth daily.      . AVASTIN IV Intravitreal by Intravitreal route. Patient receives every 7 weeks. Patient states that next dose is due on Friday 06-04-11.    Marland Kitchen CALCIUM CARBONATE-VITAMIN D 600-400 MG-UNIT PO TABS Oral Take 1 tablet by mouth 2 (two) times daily.      Marland Kitchen CIPROFLOXACIN HCL 500 MG PO TABS Oral Take 1 tablet (500 mg total) by mouth 2 (two) times daily. 14 tablet 0  . LISINOPRIL 20 MG PO TABS Oral Take 20 mg by mouth every morning.      Marland Kitchen METFORMIN HCL 500 MG PO TABS Oral Take 500 mg by mouth 2 (two) times daily after a meal.      . ICAPS AREDS FORMULA PO TABS Oral Take 1 tablet by mouth 2 (two) times daily.      Marland Kitchen PRAVASTATIN SODIUM 80 MG PO TABS Oral Take 40 mg by mouth daily.     Marland Kitchen PRAVACHOL PO Oral Take 1 tablet by mouth daily.     . TRAVOPROST 0.004 % OP SOLN Both Eyes Place 1 drop into both eyes every evening.      Marland Kitchen VITAMIN B-12 100 MCG PO TABS Oral Take 100 mcg by mouth 2 (two) times daily.        BP 138/84  Pulse 104  Temp(Src) 98 F (36.7 C) (  Oral)  Resp 18  Ht 5\' 9"  (1.753 m)  Wt 142 lb (64.411 kg)  BMI 20.97 kg/m2  SpO2 100%  Physical Exam  Nursing note and vitals reviewed. Constitutional: He appears well-developed and well-nourished. No distress.  HENT:  Head: Normocephalic and atraumatic.  Mouth/Throat: Oropharynx is clear and moist. No oropharyngeal exudate.  Eyes: Conjunctivae and EOM are normal. Pupils are equal, round, and reactive to light. Right eye exhibits no discharge. Left eye exhibits no discharge. No scleral icterus.  Neck: Normal range of motion. Neck supple. No JVD present. No thyromegaly present.  Cardiovascular: Normal rate, regular rhythm, normal heart sounds and intact distal pulses.  Exam reveals no gallop and no friction rub.   No murmur heard. Pulmonary/Chest: Effort normal and breath sounds normal. No respiratory distress. He has no wheezes. He has no rales.  Abdominal:  Soft. Bowel sounds are normal. He exhibits no distension and no mass. There is tenderness ( mild SP ttp).  Genitourinary:       Penis is normal in appearance, no pain, swelling, redness or scrotal abnormalities.  Urine at meatus.  Musculoskeletal: Normal range of motion. He exhibits no edema and no tenderness.  Lymphadenopathy:    He has no cervical adenopathy.  Neurological: He is alert. Coordination normal.  Skin: Skin is warm and dry. No rash noted. No erythema.  Psychiatric: He has a normal mood and affect. His behavior is normal.    ED Course  Procedures (including critical care time)  Labs Reviewed  URINALYSIS, ROUTINE W REFLEX MICROSCOPIC - Abnormal; Notable for the following:    APPearance CLOUDY (*)    Hgb urine dipstick LARGE (*)    Protein, ur 30 (*)    Nitrite POSITIVE (*)    Leukocytes, UA LARGE (*)    All other components within normal limits  GLUCOSE, CAPILLARY - Abnormal; Notable for the following:    Glucose-Capillary 160 (*)    All other components within normal limits  URINE MICROSCOPIC-ADD ON - Abnormal; Notable for the following:    Bacteria, UA MANY (*)    All other components within normal limits  URINE CULTURE   No results found.   1. Urinary tract infection       MDM  R/o UTI as possible source of urinary urgency and mild incontinence - has been able to give sample with minimal difficulty.  UA is cloudy and bloody - results pending.  Lab results review showing lightly elevated blood glucose at 160, urinalysis reveals too numerous to count white and red blood cells, many bacteria. This is consistent with a urinary tract infection, urinary culture obtained, ciprofloxacin given in the emergency department and prescription for 7 days for home.  Patient has been instructed on indications for return and has expressed his understanding.Marland Kitchen  at this time there is no fever, no tachycardia, no hypotension and no abdominal pain.      Vida Roller,  MD 01/20/12 [phone]

## 2012-01-20 NOTE — ED Notes (Signed)
A&ox4; in no distress; instructions/prescriptions reviewed and f/u information provided; verbalizes understanding.

## 2012-01-20 NOTE — ED Notes (Signed)
Pt and wife reports this evening pt was unable to urinate. Pt woke up this am and was incontinent. C/o belly pain.

## 2012-01-20 NOTE — H&P (Signed)
Bradley Reed is an 78 y.o. male.    PCP: Harlow Asa, MD, MD   Chief Complaint: Difficulty passing urine and chills  HPI: This is 78 year old, Caucasian male, with a past medical history of hypertension, diabetes, hypercholesterolemia, who was in his usual state of health till about 2 days ago, when he started having difficulty urination, with incontinence episodes. It was painful to urinate and he saw some blood in the urine. He came to the emergency department yesterday and was diagnosed with a urinary tract infection and was prescribed ciprofloxacin. His family also noted that the patient appeared to be confused, as well. He's been having very poor by mouth intake since yesterday. He took 2 doses of Cipro, one late last night and one this evening and then subsequently started having some chills and rigors. He felt hot, but did not check his temperature. Denies any nausea, vomiting, or any abdominal discomfort. His wife thinks that he may have had some abdominal pain yesterday. Since symptoms were not improving he decided to come back to the hospital. Denies any history of frequent UTIs. Denies any sick contacts.   Home Medications: Prior to Admission medications   Medication Sig Start Date End Date Taking? Authorizing Provider  Ascorbic Acid (VITAMIN C CR PO) Take 700 Int'l Units by mouth 2 (two) times daily.     Yes Historical Provider, MD  aspirin EC 81 MG tablet Take 81 mg by mouth daily.   Yes Historical Provider, MD  bimatoprost (LUMIGAN) 0.01 % SOLN Apply 1 drop to eye at bedtime.   Yes Historical Provider, MD  Calcium Carbonate-Vitamin D (CALCIUM 600+D) 600-400 MG-UNIT per tablet Take 1 tablet by mouth 2 (two) times daily.     Yes Historical Provider, MD  ciprofloxacin (CIPRO) 500 MG tablet Take 1 tablet (500 mg total) by mouth 2 (two) times daily. 01/20/12 01/30/12 Yes Vida Roller, MD  Glucosamine 500 MG CAPS Take 1 capsule by mouth 3 (three) times daily.   Yes Historical  Provider, MD  lisinopril (PRINIVIL,ZESTRIL) 20 MG tablet Take 20 mg by mouth every morning.     Yes Historical Provider, MD  metFORMIN (GLUCOPHAGE) 1000 MG tablet Take 1,000 mg by mouth 2 (two) times daily with a meal.   Yes Historical Provider, MD  Multiple Vitamins-Minerals (ICAPS) TABS Take 1 tablet by mouth 2 (two) times daily.     Yes Historical Provider, MD  pravastatin (PRAVACHOL) 40 MG tablet Take 40 mg by mouth every evening.   Yes Historical Provider, MD  Ranibizumab (LUCENTIS IO) Inject 1 each into the eye every 8 (eight) weeks.   Yes Historical Provider, MD  travoprost, benzalkonium, (TRAVATAN) 0.004 % ophthalmic solution Place 1 drop into both eyes every evening.     Yes Historical Provider, MD  vitamin B-12 (CYANOCOBALAMIN) 100 MCG tablet Take 100 mcg by mouth 2 (two) times daily.     Yes Historical Provider, MD  Bevacizumab (AVASTIN IV) by Intravitreal route. Patient receives every 7 weeks. Patient states that next dose is due on Friday 06-04-11.    Historical Provider, MD    Allergies: No Known Allergies  Past Medical History: Past Medical History  Diagnosis Date  . Diabetes mellitus   . Hypertension   . Hypercholesteremia   . Glaucoma     Past Surgical History  Procedure Date  . Leg surgery   . Tonsillectomy     Social History:  reports that he has quit smoking. He does not have any smokeless  tobacco history on file. He reports that he drinks about 1.8 ounces of alcohol per week. He reports that he does not use illicit drugs.  Family History: His mother died of a stroke and had hypertension. His father died of a heart attack.  Review of Systems -  unobtainable from patient due to confusion  Physical Examination Blood pressure 116/64, pulse 113, temperature 101.8 F (38.8 C), temperature source Oral, resp. rate 16, height 5\' 9"  (1.753 m), weight 71.215 kg (157 lb), SpO2 93.00%.  General appearance: alert, cooperative, appears stated age and no distress Head:  Normocephalic, without obvious abnormality, atraumatic Eyes: conjunctivae/corneas clear. PERRL, EOM's intact.  Throat: lips, mucosa, and tongue normal; teeth and gums normal Neck: no adenopathy, no carotid bruit, no JVD, supple, symmetrical, trachea midline and thyroid not enlarged, symmetric, no tenderness/mass/nodules Back: symmetric, no curvature. ROM normal. No CVA tenderness. Resp: clear to auscultation bilaterally Cardio: tachycardic, regular, no murmur, click, rub or gallop GI: soft, non-tender; bowel sounds normal; no masses,  no organomegaly Extremities: extremities normal, atraumatic, no cyanosis or edema Pulses: 2+ and symmetric Skin: Skin color, texture, turgor normal. No rashes or lesions Lymph nodes: Cervical, supraclavicular, and axillary nodes normal. Neurologic: Grossly normal. Mildly confused.  Laboratory Data: Results for orders placed during the hospital encounter of 01/20/12 (from the past 48 hour(s))  CBC     Status: Abnormal   Collection Time   01/20/12  9:22 PM      Component Value Range Comment   WBC 18.4 (*) 4.0 - 10.5 (K/uL)    RBC 3.96 (*) 4.22 - 5.81 (MIL/uL)    Hemoglobin 13.2  13.0 - 17.0 (g/dL)    HCT 16.1 (*) 09.6 - 52.0 (%)    MCV 94.7  78.0 - 100.0 (fL)    MCH 33.3  26.0 - 34.0 (pg)    MCHC 35.2  30.0 - 36.0 (g/dL)    RDW 04.5  40.9 - 81.1 (%)    Platelets 187  150 - 400 (K/uL)   DIFFERENTIAL     Status: Abnormal   Collection Time   01/20/12  9:22 PM      Component Value Range Comment   Neutrophils Relative 93 (*) 43 - 77 (%)    Neutro Abs 17.1 (*) 1.7 - 7.7 (K/uL)    Lymphocytes Relative 2 (*) 12 - 46 (%)    Lymphs Abs 0.4 (*) 0.7 - 4.0 (K/uL)    Monocytes Relative 5  3 - 12 (%)    Monocytes Absolute 0.9  0.1 - 1.0 (K/uL)    Eosinophils Relative 0  0 - 5 (%)    Eosinophils Absolute 0.0  0.0 - 0.7 (K/uL)    Basophils Relative 0  0 - 1 (%)    Basophils Absolute 0.0  0.0 - 0.1 (K/uL)   COMPREHENSIVE METABOLIC PANEL     Status: Abnormal    Collection Time   01/20/12  9:22 PM      Component Value Range Comment   Sodium 133 (*) 135 - 145 (mEq/L)    Potassium 3.5  3.5 - 5.1 (mEq/L)    Chloride 98  96 - 112 (mEq/L)    CO2 23  19 - 32 (mEq/L)    Glucose, Bld 137 (*) 70 - 99 (mg/dL)    BUN 22  6 - 23 (mg/dL)    Creatinine, Ser 9.14  0.50 - 1.35 (mg/dL)    Calcium 9.5  8.4 - 10.5 (mg/dL)    Total Protein 7.2  6.0 - 8.3 (g/dL)    Albumin 3.6  3.5 - 5.2 (g/dL)    AST 17  0 - 37 (U/L)    ALT 8  0 - 53 (U/L)    Alkaline Phosphatase 74  39 - 117 (U/L)    Total Bilirubin 1.0  0.3 - 1.2 (mg/dL)    GFR calc non Af Amer 60 (*) >90 (mL/min)    GFR calc Af Amer 70 (*) >90 (mL/min)   CULTURE, BLOOD (ROUTINE X 2)     Status: Normal (Preliminary result)   Collection Time   01/20/12  9:30 PM      Component Value Range Comment   Specimen Description BLOOD RAC      Special Requests BOTTLES DRAWN AEROBIC ONLY 4CC      Culture PENDING      Report Status PENDING     LACTIC ACID, PLASMA     Status: Normal   Collection Time   01/20/12  9:30 PM      Component Value Range Comment   Lactic Acid, Venous 1.8  0.5 - 2.2 (mmol/L)   CULTURE, BLOOD (ROUTINE X 2)     Status: Normal (Preliminary result)   Collection Time   01/20/12  9:31 PM      Component Value Range Comment   Specimen Description BLOOD RIGHT ARM      Special Requests        Value: BOTTLES DRAWN AEROBIC AND ANAEROBIC AEB [facility] ANA 10CC   Culture PENDING      Report Status PENDING       Radiology Reports: Dg Chest Port 1 View  01/20/2012  *RADIOLOGY REPORT*  Clinical Data: Fever.  PORTABLE CHEST - 1 VIEW  Comparison: None.  Findings: Mild elevation of right hemidiaphragm is seen with mild right basilar atelectasis or scarring.  No evidence of pulmonary infiltrate or edema.  No evidence of pleural effusion.  Heart size is normal.  No mass or lymphadenopathy identified.  IMPRESSION: Mild elevation of right hemidiaphragm, with mild right basilar atelectasis or scarring.  Original Report  Authenticated By: Danae Orleans, M.D.    Assessment/Plan  Principal Problem:  *UTI (lower urinary tract infection) Active Problems:  Encephalopathy, toxic  HTN (hypertension)  DM type 2 (diabetes mellitus, type 2)  Glaucoma  Hypercholesteremia   #1 urinary tract infection: Urine cultures from yesterday are still pending. He'll be treated with ceftriaxone. He appears to have mild, sepsis. He'll be given IV fluids. His lactic acid, however, is normal. He'll be given antipyretic agents.  #2 mild encephalopathy: This is most likely secondary to the urinary tract infection. His wife thinks that his symptoms are already improving. He'll be monitored closely.  #3 hypertension: We will hold his and hypertensive agents for now.  #4 history of, diabetes. Will check CBGs and put him on a sliding scale insulin.  #5 history of glaucoma. Continue with his eye drops.  He is a full code.  EGD, prophylaxis will be initiated.  Further management decisions will depend on results of further testing and patient's response to treatment.  [facility]  [facility] Pager [phone]  01/20/2012, 10:48 PM

## 2012-01-20 NOTE — ED Notes (Signed)
Contacted lab regarding blood draw.

## 2012-01-20 NOTE — ED Notes (Signed)
Patient unable to urinate at this time. 

## 2012-01-20 NOTE — ED Provider Notes (Signed)
History    This chart was scribed for Bradley Lennert, MD, MD by Bradley Reed. The patient was seen in room APA10 and the patient's care was started at 8:15PM.   CSN: [id number]  Arrival date & time 01/20/12  1955   First MD Initiated Contact with Patient 01/20/12 2013      Chief Complaint  Patient presents with  . Urinary Tract Infection  . Fever    (Consider location/radiation/quality/duration/timing/severity/associated sxs/prior treatment) Patient is a 78 y.o. male presenting with urinary tract infection and fever. The history is provided by the patient.  Urinary Tract Infection This is a new problem. The current episode started yesterday. The problem occurs constantly. The problem has not changed since onset.Pertinent negatives include no chest pain and no abdominal pain. The symptoms are aggravated by nothing. The symptoms are relieved by nothing. He has tried nothing for the symptoms.  Fever Primary symptoms of the febrile illness include fever. Primary symptoms do not include abdominal pain. The current episode started today. This is a new problem. The problem has not changed since onset.  Bradley Reed is a 78 y.o. male who presents to the Emergency Department BIB EMS complaining of UTI and moderate  fever onset 1 day ago. Pt was in ED 1 day ago and pt was diagnosed with UTI and was given dosage of Cipro. He started shivering and experiencing chills today. 1 day ago pt was having pain in lower abdomen and incontinent. The symptoms have been constant since onset without radiation.   Past Medical History  Diagnosis Date  . Diabetes mellitus   . Hypertension   . Hypercholesteremia   . Glaucoma     Past Surgical History  Procedure Date  . Leg surgery   . Tonsillectomy     No family history on file.  History  Substance Use Topics  . Smoking status: Former Games developer  . Smokeless tobacco: Not on file  . Alcohol Use: Not on file      Review of Systems    Constitutional: Positive for fever.  Cardiovascular: Negative for chest pain.  Gastrointestinal: Negative for abdominal pain.  All other systems reviewed and are negative.   10 Systems reviewed and are negative for acute change except as noted in the HPI.  Allergies  Review of patient's allergies indicates no known allergies.  Home Medications   Current Outpatient Rx  Name Route Sig Dispense Refill  . CIPROFLOXACIN HCL 500 MG PO TABS Oral Take 1 tablet (500 mg total) by mouth 2 (two) times daily. 14 tablet 0  . VITAMIN C CR PO Oral Take 700 Int'l Units by mouth 2 (two) times daily.      . ASPIRIN 81 MG PO TABS Oral Take 81 mg by mouth daily.      . AVASTIN IV Intravitreal by Intravitreal route. Patient receives every 7 weeks. Patient states that next dose is due on Friday 06-04-11.    Marland Kitchen CALCIUM CARBONATE-VITAMIN D 600-400 MG-UNIT PO TABS Oral Take 1 tablet by mouth 2 (two) times daily.      Marland Kitchen LISINOPRIL 20 MG PO TABS Oral Take 20 mg by mouth every morning.      Marland Kitchen METFORMIN HCL 500 MG PO TABS Oral Take 500 mg by mouth 2 (two) times daily after a meal.      . ICAPS AREDS FORMULA PO TABS Oral Take 1 tablet by mouth 2 (two) times daily.      Marland Kitchen PRAVASTATIN SODIUM 80 MG PO TABS  Oral Take 40 mg by mouth daily.     Marland Kitchen PRAVACHOL PO Oral Take 1 tablet by mouth daily.     . TRAVOPROST 0.004 % OP SOLN Both Eyes Place 1 drop into both eyes every evening.      Marland Kitchen VITAMIN B-12 100 MCG PO TABS Oral Take 100 mcg by mouth 2 (two) times daily.        BP 117/68  Pulse 113  Temp(Src) 99.6 F (37.6 C) (Oral)  Resp 22  Ht 5\' 9"  (1.753 m)  Wt 157 lb (71.215 kg)  BMI 23.18 kg/m2  SpO2 92%  Physical Exam  Nursing note and vitals reviewed. Constitutional: He is oriented to person, place, and time. He appears well-developed.       Shivering   HENT:  Head: Normocephalic and atraumatic.  Eyes: Conjunctivae and EOM are normal. No scleral icterus.  Neck: Neck supple. No thyromegaly present.   Cardiovascular: Regular rhythm.  Tachycardia present.  Exam reveals no gallop and no friction rub.   No murmur heard. Pulmonary/Chest: No stridor. He has no wheezes. He has no rales. He exhibits no tenderness.  Abdominal: He exhibits no distension. There is no tenderness. There is no rebound.  Musculoskeletal: Normal range of motion. He exhibits no edema.  Lymphadenopathy:    He has no cervical adenopathy.  Neurological: He is oriented to person, place, and time. Coordination normal.  Skin: No rash noted. No erythema.  Psychiatric: He has a normal mood and affect. His behavior is normal.    ED Course  Procedures (including critical care time)  DIAGNOSTIC STUDIES: Oxygen Saturation is 92% on room air, adequate  by my interpretation.    COORDINATION OF CARE: 8:20PM EDP discusses pt ED treatment course with pt  8:30PM EDP ordered medication: 0.9% NaCl bolus, Rocephin 1 g    Labs Reviewed  CBC - Abnormal; Notable for the following:    WBC 18.4 (*)    RBC 3.96 (*)    HCT 37.5 (*)    All other components within normal limits  DIFFERENTIAL - Abnormal; Notable for the following:    Neutrophils Relative 93 (*)    Neutro Abs 17.1 (*)    Lymphocytes Relative 2 (*)    Lymphs Abs 0.4 (*)    All other components within normal limits  COMPREHENSIVE METABOLIC PANEL - Abnormal; Notable for the following:    Sodium 133 (*)    Glucose, Bld 137 (*)    GFR calc non Af Amer 60 (*)    GFR calc Af Amer 70 (*)    All other components within normal limits  CULTURE, BLOOD (ROUTINE X 2)  CULTURE, BLOOD (ROUTINE X 2)  LACTIC ACID, PLASMA  URINALYSIS, ROUTINE W REFLEX MICROSCOPIC  URINE CULTURE   Dg Chest Port 1 View  01/20/2012  *RADIOLOGY REPORT*  Clinical Data: Fever.  PORTABLE CHEST - 1 VIEW  Comparison: None.  Findings: Mild elevation of right hemidiaphragm is seen with mild right basilar atelectasis or scarring.  No evidence of pulmonary infiltrate or edema.  No evidence of pleural  effusion.  Heart size is normal.  No mass or lymphadenopathy identified.  IMPRESSION: Mild elevation of right hemidiaphragm, with mild right basilar atelectasis or scarring.  Original Report Authenticated By: Danae Orleans, M.D.     No diagnosis found.    MDM  Fever and chills from uti The chart was scribed for me under my direct supervision.  I personally performed the history, physical, and medical decision  making and all procedures in the evaluation of this patient.Bradley Lennert, MD 01/20/12 2237

## 2012-01-20 NOTE — Discharge Instructions (Signed)

## 2012-01-20 NOTE — ED Notes (Signed)
Patient seen here for UTI yesterday, sent here today for fever and chills per EMS. Patient is a poor historian.

## 2012-01-21 LAB — HEMOGLOBIN A1C: Hgb A1c MFr Bld: 6.1 % — ABNORMAL HIGH (ref <5.7)

## 2012-01-21 LAB — COMPREHENSIVE METABOLIC PANEL
ALT: 8 U/L (ref 0–53)
AST: 18 U/L (ref 0–37)
Alkaline Phosphatase: 52 U/L (ref 39–117)
CO2: 26 mEq/L (ref 19–32)
Chloride: 99 mEq/L (ref 96–112)
Creatinine, Ser: 1.2 mg/dL (ref 0.50–1.35)
GFR calc non Af Amer: 53 mL/min — ABNORMAL LOW (ref >90)
Potassium: 4 mEq/L (ref 3.5–5.1)
Total Bilirubin: 0.6 mg/dL (ref 0.3–1.2)

## 2012-01-21 LAB — CBC
MCH: 33.7 pg (ref 26.0–34.0)
MCHC: 34.9 g/dL (ref 30.0–36.0)
MCV: 96.6 fL (ref 78.0–100.0)
Platelets: 187 10*3/uL (ref 150–400)
RBC: 3.56 MIL/uL — ABNORMAL LOW (ref 4.22–5.81)
RDW: 13.7 % (ref 11.5–15.5)

## 2012-01-21 LAB — GLUCOSE, CAPILLARY: Glucose-Capillary: 127 mg/dL — ABNORMAL HIGH (ref 70–99)

## 2012-01-21 MED ORDER — SALINE SPRAY 0.65 % NA SOLN
2.0000 | NASAL | Status: DC | PRN
  Administered 2012-01-21: 2 via NASAL
  Filled 2012-01-21: qty 44

## 2012-01-21 NOTE — Progress Notes (Signed)
CARE MANAGEMENT NOTE 01/21/2012  Patient:  NASEEM, VARDEN   Account Number:  [id number]  Date Initiated:  01/21/2012  Documentation initiated by:  Rosemary Holms  Subjective/Objective Assessment:   Pt admitted with Fever and UTI. PTA, lives at home with spouse.     Action/Plan:   Spoke to pt and spouse. Wife stated they used [facility] in the past and if [facility] needed, would like to use them.   Anticipated DC Date:  01/23/2012   Anticipated DC Plan:  HOME/SELF CARE      DC Planning Services  CM consult      Choice offered to / List presented to:             Status of service:  In process, will continue to follow Medicare Important Message given?   (If response is "NO", the following Medicare IM given date fields will be blank) Date Medicare IM given:   Date Additional Medicare IM given:    Discharge Disposition:    Per UR Regulation:    If discussed at Long Length of Stay Meetings, dates discussed:    Comments:  01/21/12 1100 Bernard Slayden Leanord Hawking RN BSN CM

## 2012-01-21 NOTE — Progress Notes (Signed)
Chart reviewed.  Subjective: Able to urinate, but complaining of hesitancy. No pain. Still quite weak. Complaining of dry nose. Requesting saline spray. Patient's daughter reports that confusion is improved, but not yet back to baseline.  Objective: Vital signs in last 24 hours: Filed Vitals:   01/20/12 2300 01/20/12 2316 01/20/12 2348 01/21/12 0501  BP: 103/63  97/58 96/60  Pulse: 101  97 83  Temp:  98.1 F (36.7 C) 98.2 F (36.8 C) 97.9 F (36.6 C)  TempSrc:  Oral Oral Oral  Resp:   18 18  Height:   5\' 9"  (1.753 m)   Weight:   65.454 kg (144 lb 4.8 oz)   SpO2: 92%  92% 94%   Weight change:   Intake/Output Summary (Last 24 hours) at 01/21/12 1352 Last data filed at 01/21/12 1300  Gross per 24 hour  Intake    720 ml  Output    400 ml  Net    320 ml   Physical Exam:  General: Alert. Weak appearing HEENT dry mucous membranes Lungs clear to auscultation bilaterally without wheeze rhonchi or rales Cardiovascular regular rate rhythm without murmurs gas rubs Abdomen soft nontender nondistended Extremities no clubbing cyanosis or edema Back no CVA tenderness Neurologic alert, oriented but speech is hesitant and he is forgetful  Lab Results: Basic Metabolic Panel:  Lab 01/21/12 4098 01/20/12 2122  NA 134* 133*  K 4.0 3.5  CL 99 98  CO2 26 23  GLUCOSE 145* 137*  BUN 25* 22  CREATININE 1.20 1.08  CALCIUM 9.1 9.5  MG -- --  PHOS -- --   Liver Function Tests:  Lab 01/21/12 0456 01/20/12 2122  AST 18 17  ALT 8 8  ALKPHOS 52 74  BILITOT 0.6 1.0  PROT 6.5 7.2  ALBUMIN 3.1* 3.6   No results found for this basename: LIPASE:2,AMYLASE:2 in the last 168 hours No results found for this basename: AMMONIA:2 in the last 168 hours CBC:  Lab 01/21/12 0456 01/20/12 2122  WBC 22.7* 18.4*  NEUTROABS -- 17.1*  HGB 12.0* 13.2  HCT 34.4* 37.5*  MCV 96.6 94.7  PLT 187 187   Cardiac Enzymes: No results found for this basename: CKTOTAL:3,CKMB:3,CKMBINDEX:3,TROPONINI:3 in  the last 168 hours BNP: No results found for this basename: PROBNP:3 in the last 168 hours D-Dimer: No results found for this basename: DDIMER:2 in the last 168 hours CBG:  Lab 01/21/12 1132 01/21/12 0753 01/21/12 0032 01/20/12 0419  GLUCAP 96 127* 147* 160*   Hemoglobin A1C: No results found for this basename: HGBA1C in the last 168 hours Fasting Lipid Panel: No results found for this basename: CHOL,HDL,LDLCALC,TRIG,CHOLHDL,LDLDIRECT in the last 119 hours Thyroid Function Tests: No results found for this basename: TSH,T4TOTAL,FREET4,T3FREE,THYROIDAB in the last 168 hours Coagulation: No results found for this basename: LABPROT:4,INR:4 in the last 168 hours Anemia Panel: No results found for this basename: VITAMINB12,FOLATE,FERRITIN,TIBC,IRON,RETICCTPCT in the last 168 hours Urine Drug Screen: Drugs of Abuse  No results found for this basename: labopia, cocainscrnur, labbenz, amphetmu, thcu, labbarb    Alcohol Level: No results found for this basename: ETH:2 in the last 168 hours Urinalysis:  Lab 01/20/12 0420  COLORURINE YELLOW  LABSPEC 1.010  PHURINE 7.0  GLUCOSEU NEGATIVE  HGBUR LARGE*  BILIRUBINUR NEGATIVE  KETONESUR NEGATIVE  PROTEINUR 30*  UROBILINOGEN 0.2  NITRITE POSITIVE*  LEUKOCYTESUR LARGE*   Micro Results: Recent Results (from the past 240 hour(s))  URINE CULTURE     Status: Normal (Preliminary result)   Collection Time  01/20/12  4:20 AM      Component Value Range Status Comment   Specimen Description URINE, CLEAN CATCH   Final    Special Requests NONE   Final    Culture  Setup Time [id number]   Final    Colony Count >=100,000 COLONIES/ML   Final    Culture ESCHERICHIA COLI   Final    Report Status PENDING   Incomplete   CULTURE, BLOOD (ROUTINE X 2)     Status: Normal (Preliminary result)   Collection Time   01/20/12  9:30 PM      Component Value Range Status Comment   Specimen Description BLOOD RAC   Final    Special Requests BOTTLES DRAWN  AEROBIC ONLY 4CC   Final    Culture NO GROWTH 1 DAY   Final    Report Status PENDING   Incomplete   CULTURE, BLOOD (ROUTINE X 2)     Status: Normal (Preliminary result)   Collection Time   01/20/12  9:31 PM      Component Value Range Status Comment   Specimen Description BLOOD RIGHT ARM   Final    Special Requests     Final    Value: BOTTLES DRAWN AEROBIC AND ANAEROBIC AEB 9CC ANA 10CC   Culture NO GROWTH 1 DAY   Final    Report Status PENDING   Incomplete    Studies/Results: Dg Chest Port 1 View  01/20/2012  *RADIOLOGY REPORT*  Clinical Data: Fever.  PORTABLE CHEST - 1 VIEW  Comparison: None.  Findings: Mild elevation of right hemidiaphragm is seen with mild right basilar atelectasis or scarring.  No evidence of pulmonary infiltrate or edema.  No evidence of pleural effusion.  Heart size is normal.  No mass or lymphadenopathy identified.  IMPRESSION: Mild elevation of right hemidiaphragm, with mild right basilar atelectasis or scarring.  Original Report Authenticated By: Danae Orleans, M.D.   Scheduled Meds:   . sodium chloride   Intravenous Once  . aspirin EC  81 mg Oral Daily  . bimatoprost  1 drop Both Eyes QHS  . cefTRIAXone (ROCEPHIN) 1 GM IVPB  1 g Intravenous Once  . cefTRIAXone (ROCEPHIN)  IV  1 g Intravenous Q24H  . enoxaparin  40 mg Subcutaneous Q24H  . insulin aspart  0-15 Units Subcutaneous TID WC  . simvastatin  20 mg Oral q1800   Continuous Infusions:   . sodium chloride 100 mL/hr at 01/21/12 1156   PRN Meds:.acetaminophen, albuterol, ondansetron (ZOFRAN) IV, ondansetron, sodium chloride, DISCONTD: acetaminophen Assessment/Plan: Principal Problem:  *UTI (lower urinary tract infection), culture growing Escherichia coli Active Problems:  Encephalopathy, toxic  HTN (hypertension)  DM type 2 (diabetes mellitus, type 2)  Glaucoma  Hypercholesteremia Dehydration Weakness  Continue IV fluids Rocephin and and supportive care. Followup culture results.   LOS: 1 day    Lydia Meng L 01/21/2012, 1:52 PM

## 2012-01-22 LAB — GLUCOSE, CAPILLARY: Glucose-Capillary: 122 mg/dL — ABNORMAL HIGH (ref 70–99)

## 2012-01-22 LAB — URINE CULTURE
Colony Count: >=100000
Culture  Setup Time: 201303141318

## 2012-01-22 MED ORDER — SULFAMETHOXAZOLE-TRIMETHOPRIM 800-160 MG PO TABS: 1.0000 | ORAL_TABLET | Freq: Two times a day (BID) | ORAL | Status: AC

## 2012-01-22 NOTE — Progress Notes (Signed)
Pt discharged home today per Dr. Lendell Caprice. Pt's IV site d/c'd and WNL. Pt's VS stable at this time. Pt provided with home medication list, discharge instructions and prescriptions. Pt verbalized understanding. Left floor via WC in stable condition accompanied by NT.

## 2012-01-22 NOTE — Discharge Instructions (Signed)
 Urinary Tract Infection Infections of the urinary tract can start in several places. A bladder infection (cystitis), a kidney infection (pyelonephritis), and a prostate infection (prostatitis) are different types of urinary tract infections (UTIs). They usually get better if treated with medicines (antibiotics) that kill germs. Take all the medicine until it is gone. You or your child may feel better in a few days, but TAKE ALL MEDICINE or the infection may not respond and may become more difficult to treat. HOME CARE INSTRUCTIONS   Drink enough water and fluids to keep the urine clear or pale yellow. Cranberry juice is especially recommended, in addition to large amounts of water.   Avoid caffeine, tea, and carbonated beverages. They tend to irritate the bladder.   Alcohol may irritate the prostate.   Only take over-the-counter or prescription medicines for pain, discomfort, or fever as directed by your caregiver.  To prevent further infections:  Empty the bladder often. Avoid holding urine for long periods of time.   After a bowel movement, women should cleanse from front to back. Use each tissue only once.   Empty the bladder before and after sexual intercourse.  FINDING OUT THE RESULTS OF YOUR TEST Not all test results are available during your visit. If your or your child's test results are not back during the visit, make an appointment with your caregiver to find out the results. Do not assume everything is normal if you have not heard from your caregiver or the medical facility. It is important for you to follow up on all test results. SEEK MEDICAL CARE IF:   There is back pain.   Your baby is older than 3 months with a rectal temperature of 100.5 F (38.1 C) or higher for more than 1 day.   Your or your child's problems (symptoms) are no better in 3 days. Return sooner if you or your child is getting worse.  SEEK IMMEDIATE MEDICAL CARE IF:   There is severe back pain or lower  abdominal pain.   You or your child develops chills.   You have a fever.   Your baby is older than 3 months with a rectal temperature of 102 F (38.9 C) or higher.   Your baby is 9 months old or younger with a rectal temperature of 100.4 F (38 C) or higher.   There is nausea or vomiting.   There is continued burning or discomfort with urination.  MAKE SURE YOU:   Understand these instructions.   Will watch your condition.   Will get help right away if you are not doing well or get worse.  Document Released: 08/04/2005 Document Revised: 10/14/2011 Document Reviewed: 03/09/2007 [facility] Patient Information 2012 [place], [place].

## 2012-01-22 NOTE — Discharge Summary (Signed)
Physician Discharge Summary  Patient ID: Bradley Reed MRN: [id number] DOB/AGE: 02/13/1924 78 y.o.  Admit date: 01/20/2012 Discharge date: 01/22/2012  Discharge Diagnoses:  Principal Problem:  *UTI (lower urinary tract infection), E Coli Active Problems:  Encephalopathy, toxic  HTN (hypertension)  DM type 2 (diabetes mellitus, type 2)  Glaucoma  Hypercholesteremia   Medication List  As of 01/22/2012  4:20 PM   STOP taking these medications         ciprofloxacin 500 MG tablet         TAKE these medications         aspirin EC 81 MG tablet   Take 81 mg by mouth daily.      Calcium 600+D 600-400 MG-UNIT per tablet   Generic drug: Calcium Carbonate-Vitamin D   Take 1 tablet by mouth 2 (two) times daily.      Glucosamine 500 MG Caps   Take 1 capsule by mouth 3 (three) times daily.      ICAPS Tabs   Take 1 tablet by mouth 2 (two) times daily.      lisinopril 20 MG tablet   Commonly known as: PRINIVIL,ZESTRIL   Take 20 mg by mouth every morning.      LUCENTIS IO   Inject 1 each into the eye every 8 (eight) weeks.      metFORMIN 1000 MG tablet   Commonly known as: GLUCOPHAGE   Take 1,000 mg by mouth 2 (two) times daily with a meal.      pravastatin 40 MG tablet   Commonly known as: PRAVACHOL   Take 40 mg by mouth every evening.      sulfamethoxazole-trimethoprim 800-160 MG per tablet   Commonly known as: BACTRIM DS,SEPTRA DS   Take 1 tablet by mouth 2 (two) times daily.      travoprost (benzalkonium) 0.004 % ophthalmic solution   Commonly known as: TRAVATAN   Place 1 drop into both eyes every evening.      vitamin B-12 100 MCG tablet   Commonly known as: CYANOCOBALAMIN   Take 100 mcg by mouth 2 (two) times daily.      vitamin C 500 MG tablet   Commonly known as: ASCORBIC ACID   Take 500 mg by mouth daily.            Discharge Orders    Future Orders Please Complete By Expires   Diet general      Increase activity slowly         Follow-up  Information    Follow up with Harlow Asa, MD. (If symptoms worsen)          Disposition: 01-Home or Self Care  Discharged Condition: stable  Consults:  none  Labs:   Results for orders placed during the hospital encounter of 01/20/12 (from the past 48 hour(s))  CBC     Status: Abnormal   Collection Time   01/20/12  9:22 PM      Component Value Range Comment   WBC 18.4 (*) 4.0 - 10.5 (K/uL)    RBC 3.96 (*) 4.22 - 5.81 (MIL/uL)    Hemoglobin 13.2  13.0 - 17.0 (g/dL)    HCT 40.9 (*) 81.1 - 52.0 (%)    MCV 94.7  78.0 - 100.0 (fL)    MCH 33.3  26.0 - 34.0 (pg)    MCHC 35.2  30.0 - 36.0 (g/dL)    RDW 91.4  78.2 - 95.6 (%)    Platelets 187  150 -  400 (K/uL)   DIFFERENTIAL     Status: Abnormal   Collection Time   01/20/12  9:22 PM      Component Value Range Comment   Neutrophils Relative 93 (*) 43 - 77 (%)    Neutro Abs 17.1 (*) 1.7 - 7.7 (K/uL)    Lymphocytes Relative 2 (*) 12 - 46 (%)    Lymphs Abs 0.4 (*) 0.7 - 4.0 (K/uL)    Monocytes Relative 5  3 - 12 (%)    Monocytes Absolute 0.9  0.1 - 1.0 (K/uL)    Eosinophils Relative 0  0 - 5 (%)    Eosinophils Absolute 0.0  0.0 - 0.7 (K/uL)    Basophils Relative 0  0 - 1 (%)    Basophils Absolute 0.0  0.0 - 0.1 (K/uL)   COMPREHENSIVE METABOLIC PANEL     Status: Abnormal   Collection Time   01/20/12  9:22 PM      Component Value Range Comment   Sodium 133 (*) 135 - 145 (mEq/L)    Potassium 3.5  3.5 - 5.1 (mEq/L)    Chloride 98  96 - 112 (mEq/L)    CO2 23  19 - 32 (mEq/L)    Glucose, Bld 137 (*) 70 - 99 (mg/dL)    BUN 22  6 - 23 (mg/dL)    Creatinine, Ser 0.45  0.50 - 1.35 (mg/dL)    Calcium 9.5  8.4 - 10.5 (mg/dL)    Total Protein 7.2  6.0 - 8.3 (g/dL)    Albumin 3.6  3.5 - 5.2 (g/dL)    AST 17  0 - 37 (U/L)    ALT 8  0 - 53 (U/L)    Alkaline Phosphatase 74  39 - 117 (U/L)    Total Bilirubin 1.0  0.3 - 1.2 (mg/dL)    GFR calc non Af Amer 60 (*) >90 (mL/min)    GFR calc Af Amer 70 (*) >90 (mL/min)   HEMOGLOBIN A1C      Status: Abnormal   Collection Time   01/20/12  9:22 PM      Component Value Range Comment   Hemoglobin A1C 6.1 (*) <5.7 (%)    Mean Plasma Glucose 128 (*) <117 (mg/dL)   CULTURE, BLOOD (ROUTINE X 2)     Status: Normal (Preliminary result)   Collection Time   01/20/12  9:30 PM      Component Value Range Comment   Specimen Description BLOOD RAC      Special Requests BOTTLES DRAWN AEROBIC ONLY 4CC      Culture NO GROWTH 1 DAY      Report Status PENDING     LACTIC ACID, PLASMA     Status: Normal   Collection Time   01/20/12  9:30 PM      Component Value Range Comment   Lactic Acid, Venous 1.8  0.5 - 2.2 (mmol/L)   CULTURE, BLOOD (ROUTINE X 2)     Status: Normal (Preliminary result)   Collection Time   01/20/12  9:31 PM      Component Value Range Comment   Specimen Description BLOOD RIGHT ARM      Special Requests        Value: BOTTLES DRAWN AEROBIC AND ANAEROBIC AEB 9CC ANA 10CC   Culture NO GROWTH 1 DAY      Report Status PENDING     GLUCOSE, CAPILLARY     Status: Abnormal   Collection Time   01/21/12 12:32 AM  Component Value Range Comment   Glucose-Capillary 147 (*) 70 - 99 (mg/dL)   COMPREHENSIVE METABOLIC PANEL     Status: Abnormal   Collection Time   01/21/12  4:56 AM      Component Value Range Comment   Sodium 134 (*) 135 - 145 (mEq/L)    Potassium 4.0  3.5 - 5.1 (mEq/L)    Chloride 99  96 - 112 (mEq/L)    CO2 26  19 - 32 (mEq/L)    Glucose, Bld 145 (*) 70 - 99 (mg/dL)    BUN 25 (*) 6 - 23 (mg/dL)    Creatinine, Ser 1.61  0.50 - 1.35 (mg/dL)    Calcium 9.1  8.4 - 10.5 (mg/dL)    Total Protein 6.5  6.0 - 8.3 (g/dL)    Albumin 3.1 (*) 3.5 - 5.2 (g/dL)    AST 18  0 - 37 (U/L)    ALT 8  0 - 53 (U/L)    Alkaline Phosphatase 52  39 - 117 (U/L)    Total Bilirubin 0.6  0.3 - 1.2 (mg/dL)    GFR calc non Af Amer 53 (*) >90 (mL/min)    GFR calc Af Amer 61 (*) >90 (mL/min)   CBC     Status: Abnormal   Collection Time   01/21/12  4:56 AM      Component Value Range Comment     WBC 22.7 (*) 4.0 - 10.5 (K/uL)    RBC 3.56 (*) 4.22 - 5.81 (MIL/uL)    Hemoglobin 12.0 (*) 13.0 - 17.0 (g/dL)    HCT 09.6 (*) 04.5 - 52.0 (%)    MCV 96.6  78.0 - 100.0 (fL)    MCH 33.7  26.0 - 34.0 (pg)    MCHC 34.9  30.0 - 36.0 (g/dL)    RDW 40.9  81.1 - 91.4 (%)    Platelets 187  150 - 400 (K/uL)   GLUCOSE, CAPILLARY     Status: Abnormal   Collection Time   01/21/12  7:53 AM      Component Value Range Comment   Glucose-Capillary 127 (*) 70 - 99 (mg/dL)    Comment 1 Documented in Chart      Comment 2 Notify RN     GLUCOSE, CAPILLARY     Status: Normal   Collection Time   01/21/12 11:32 AM      Component Value Range Comment   Glucose-Capillary 96  70 - 99 (mg/dL)    Comment 1 Documented in Chart      Comment 2 Notify RN     GLUCOSE, CAPILLARY     Status: Abnormal   Collection Time   01/21/12  4:08 PM      Component Value Range Comment   Glucose-Capillary 115 (*) 70 - 99 (mg/dL)    Comment 1 Notify RN      Comment 2 Documented in Chart     GLUCOSE, CAPILLARY     Status: Abnormal   Collection Time   01/21/12  9:58 PM      Component Value Range Comment   Glucose-Capillary 125 (*) 70 - 99 (mg/dL)   GLUCOSE, CAPILLARY     Status: Abnormal   Collection Time   01/22/12  8:47 AM      Component Value Range Comment   Glucose-Capillary 122 (*) 70 - 99 (mg/dL)    Comment 1 Notify RN     GLUCOSE, CAPILLARY     Status: Abnormal   Collection Time  01/22/12 11:30 AM      Component Value Range Comment   Glucose-Capillary 155 (*) 70 - 99 (mg/dL)     Diagnostics:  Dg Chest Port 1 View  01/20/2012  *RADIOLOGY REPORT*  Clinical Data: Fever.  PORTABLE CHEST - 1 VIEW  Comparison: None.  Findings: Mild elevation of right hemidiaphragm is seen with mild right basilar atelectasis or scarring.  No evidence of pulmonary infiltrate or edema.  No evidence of pleural effusion.  Heart size is normal.  No mass or lymphadenopathy identified.  IMPRESSION: Mild elevation of right hemidiaphragm, with mild  right basilar atelectasis or scarring.  Original Report Authenticated By: Danae Orleans, M.D.   Full Code   Hospital Course:  See H&P for complete admission details. 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 He was having urinary hesitancy. Incontinence and dysuria. Some hematuria. He had been started on Cipro prior to admission. In the emergency room, his temperature was 101.8. Heart rate 113. Blood pressure within normal limits. He had dry mucous membranes was confused, soft nontender abdomen, no CVA tenderness. His white blood cell count was 18,000.  Patient was started on IV fluid, Rocephin and supportive care. His mental status cleared. He is currently near his baseline. Urine culture grew out pansensitive Escherichia coli. I have stopped his Cipro in case that was contributing to his confusion. He is encouraged to drink plenty of fluids and may resume his other medications.   Discharge Exam:  Blood pressure 124/82, pulse 75, temperature 98.2 F (36.8 C), temperature source Oral, resp. rate 20, height 5\' 9"  (1.753 m), weight 65.454 kg (144 lb 4.8 oz), SpO2 96.00%.  General: Much more alert and conversant. Less forgetful. Speech less halting. HEENT: Moist mucous membranes Lungs clear to auscultation bilaterally without wheeze rhonchi or rales Cardiovascular regular rate rhythm without murmurs gallops rubs Abdomen soft nontender nondistended    Signed: Julia Kulzer L 01/22/2012, 4:20 PM

## 2012-01-25 LAB — CULTURE, BLOOD (ROUTINE X 2)
Culture: NO GROWTH
Culture: NO GROWTH

## 2012-02-25 ENCOUNTER — Inpatient Hospital Stay (HOSPITAL_COMMUNITY)
Admission: EM | Admit: 2012-02-25 | Discharge: 2012-02-27 | DRG: 872 | Disposition: A | Discharge status: Home / Self Care | Payer: Medicare Other | Attending: Internal Medicine | Admitting: Internal Medicine

## 2012-02-25 ENCOUNTER — Inpatient Hospital Stay (HOSPITAL_COMMUNITY): Payer: Medicare Other

## 2012-02-25 ENCOUNTER — Encounter (HOSPITAL_COMMUNITY): Payer: Self-pay | Admitting: *Deleted

## 2012-02-25 DIAGNOSIS — A419 Sepsis, unspecified organism: Principal | ICD-10-CM

## 2012-02-25 DIAGNOSIS — N390 Urinary tract infection, site not specified: Secondary | ICD-10-CM

## 2012-02-25 DIAGNOSIS — E119 Type 2 diabetes mellitus without complications: Secondary | ICD-10-CM | POA: Diagnosis present

## 2012-02-25 DIAGNOSIS — E7800 Pure hypercholesterolemia, unspecified: Secondary | ICD-10-CM | POA: Diagnosis present

## 2012-02-25 DIAGNOSIS — Z7982 Long term (current) use of aspirin: Secondary | ICD-10-CM

## 2012-02-25 DIAGNOSIS — D72829 Elevated white blood cell count, unspecified: Secondary | ICD-10-CM | POA: Diagnosis present

## 2012-02-25 DIAGNOSIS — H409 Unspecified glaucoma: Secondary | ICD-10-CM | POA: Diagnosis present

## 2012-02-25 DIAGNOSIS — R6510 Systemic inflammatory response syndrome (SIRS) of non-infectious origin without acute organ dysfunction: Secondary | ICD-10-CM | POA: Diagnosis present

## 2012-02-25 DIAGNOSIS — E860 Dehydration: Secondary | ICD-10-CM | POA: Diagnosis present

## 2012-02-25 DIAGNOSIS — Z883 Allergy status to other anti-infective agents status: Secondary | ICD-10-CM

## 2012-02-25 DIAGNOSIS — I10 Essential (primary) hypertension: Secondary | ICD-10-CM | POA: Diagnosis present

## 2012-02-25 DIAGNOSIS — Z79899 Other long term (current) drug therapy: Secondary | ICD-10-CM

## 2012-02-25 LAB — URINALYSIS, MICROSCOPIC ONLY
Bilirubin Urine: NEGATIVE
Glucose, UA: NEGATIVE mg/dL
pH: 7 (ref 5.0–8.0)

## 2012-02-25 LAB — BASIC METABOLIC PANEL
BUN: 24 mg/dL — ABNORMAL HIGH (ref 6–23)
CO2: 25 mEq/L (ref 19–32)
Chloride: 97 mEq/L (ref 96–112)
Creatinine, Ser: 1.17 mg/dL (ref 0.50–1.35)
GFR calc Af Amer: 63 mL/min — ABNORMAL LOW (ref >90)
Glucose, Bld: 145 mg/dL — ABNORMAL HIGH (ref 70–99)

## 2012-02-25 LAB — CBC
HCT: 35.7 % — ABNORMAL LOW (ref 39.0–52.0)
Hemoglobin: 12.3 g/dL — ABNORMAL LOW (ref 13.0–17.0)
MCH: 33.1 pg (ref 26.0–34.0)
MCHC: 34.5 g/dL (ref 30.0–36.0)

## 2012-02-25 LAB — DIFFERENTIAL
Basophils Relative: 0 % (ref 0–1)
Lymphs Abs: 0.8 10*3/uL (ref 0.7–4.0)
Monocytes Absolute: 1.5 10*3/uL — ABNORMAL HIGH (ref 0.1–1.0)
Monocytes Relative: 7 % (ref 3–12)
Neutro Abs: 20 10*3/uL — ABNORMAL HIGH (ref 1.7–7.7)

## 2012-02-25 LAB — HEPATIC FUNCTION PANEL
AST: 19 U/L (ref 0–37)
Albumin: 3.8 g/dL (ref 3.5–5.2)
Alkaline Phosphatase: 70 U/L (ref 39–117)
Bilirubin, Direct: 0.1 mg/dL (ref 0.0–0.3)
Total Bilirubin: 0.5 mg/dL (ref 0.3–1.2)

## 2012-02-25 LAB — GLUCOSE, CAPILLARY: Glucose-Capillary: 151 mg/dL — ABNORMAL HIGH (ref 70–99)

## 2012-02-25 LAB — PSA: PSA: 6.11 ng/mL — ABNORMAL HIGH (ref <=4.00)

## 2012-02-25 MED ORDER — DEXTROSE 5 % IV SOLN: 1.0000 g | Freq: Every day | INTRAVENOUS | Status: DC

## 2012-02-25 MED ORDER — TRAZODONE HCL 50 MG PO TABS: 25.0000 mg | ORAL_TABLET | Freq: Every evening | ORAL | Status: DC | PRN

## 2012-02-25 MED ORDER — IOHEXOL 300 MG/ML  SOLN
100.0000 mL | Freq: Once | INTRAMUSCULAR | Status: AC | PRN
  Administered 2012-02-25: 100 mL via INTRAVENOUS

## 2012-02-25 MED ORDER — ACETAMINOPHEN 500 MG PO TABS
1000.0000 mg | ORAL_TABLET | Freq: Once | ORAL | Status: AC
  Administered 2012-02-25: 1000 mg via ORAL
  Filled 2012-02-25: qty 2

## 2012-02-25 MED ORDER — TRAVOPROST (BAK FREE) 0.004 % OP SOLN
1.0000 [drp] | Freq: Every evening | OPHTHALMIC | Status: DC
  Administered 2012-02-25 – 2012-02-26 (×2): 1 [drp] via OPHTHALMIC
  Filled 2012-02-25: qty 2.5

## 2012-02-25 MED ORDER — ASPIRIN EC 81 MG PO TBEC
81.0000 mg | DELAYED_RELEASE_TABLET | Freq: Every day | ORAL | Status: DC
  Administered 2012-02-25 – 2012-02-27 (×3): 81 mg via ORAL
  Filled 2012-02-25 (×3): qty 1

## 2012-02-25 MED ORDER — ACETAMINOPHEN 650 MG RE SUPP: 650.0000 mg | Freq: Four times a day (QID) | RECTAL | Status: DC | PRN

## 2012-02-25 MED ORDER — METFORMIN HCL 500 MG PO TABS
1000.0000 mg | ORAL_TABLET | Freq: Two times a day (BID) | ORAL | Status: DC
  Administered 2012-02-25: 1000 mg via ORAL
  Filled 2012-02-25: qty 2
  Filled 2012-02-25: qty 1

## 2012-02-25 MED ORDER — ONDANSETRON HCL 4 MG PO TABS: 4.0000 mg | ORAL_TABLET | Freq: Four times a day (QID) | ORAL | Status: DC | PRN

## 2012-02-25 MED ORDER — SIMVASTATIN 20 MG PO TABS
20.0000 mg | ORAL_TABLET | Freq: Every day | ORAL | Status: DC
  Administered 2012-02-25 – 2012-02-26 (×2): 20 mg via ORAL
  Filled 2012-02-25 (×2): qty 1

## 2012-02-25 MED ORDER — INSULIN GLARGINE 100 UNIT/ML ~~LOC~~ SOLN
4.0000 [IU] | SUBCUTANEOUS | Status: DC
  Administered 2012-02-25 – 2012-02-27 (×3): 4 [IU] via SUBCUTANEOUS

## 2012-02-25 MED ORDER — INSULIN ASPART 100 UNIT/ML ~~LOC~~ SOLN
0.0000 [IU] | Freq: Three times a day (TID) | SUBCUTANEOUS | Status: DC
  Administered 2012-02-25: 2 [IU] via SUBCUTANEOUS
  Administered 2012-02-26: 1 [IU] via SUBCUTANEOUS

## 2012-02-25 MED ORDER — SODIUM CHLORIDE 0.9 % IV BOLUS (SEPSIS)
1000.0000 mL | Freq: Once | INTRAVENOUS | Status: AC
  Administered 2012-02-25: 1000 mL via INTRAVENOUS

## 2012-02-25 MED ORDER — LISINOPRIL 10 MG PO TABS
20.0000 mg | ORAL_TABLET | Freq: Every day | ORAL | Status: DC
  Administered 2012-02-25 – 2012-02-27 (×3): 20 mg via ORAL
  Filled 2012-02-25 (×3): qty 2

## 2012-02-25 MED ORDER — SODIUM CHLORIDE 0.9 % IV SOLN: INTRAVENOUS | Status: DC

## 2012-02-25 MED ORDER — INSULIN ASPART 100 UNIT/ML ~~LOC~~ SOLN: 0.0000 [IU] | Freq: Every day | SUBCUTANEOUS | Status: DC

## 2012-02-25 MED ORDER — ONDANSETRON HCL 4 MG/2ML IJ SOLN: 4.0000 mg | Freq: Four times a day (QID) | INTRAMUSCULAR | Status: DC | PRN

## 2012-02-25 MED ORDER — VANCOMYCIN HCL IN DEXTROSE 1-5 GM/200ML-% IV SOLN
1000.0000 mg | INTRAVENOUS | Status: DC
  Administered 2012-02-25 – 2012-02-26 (×2): 1000 mg via INTRAVENOUS
  Filled 2012-02-25 (×4): qty 200

## 2012-02-25 MED ORDER — IOHEXOL 300 MG/ML  SOLN: 40.0000 mL | Freq: Once | INTRAMUSCULAR | Status: AC | PRN

## 2012-02-25 MED ORDER — PIPERACILLIN-TAZOBACTAM 3.375 G IVPB
3.3750 g | Freq: Three times a day (TID) | INTRAVENOUS | Status: DC
  Administered 2012-02-25 – 2012-02-27 (×6): 3.375 g via INTRAVENOUS
  Filled 2012-02-25 (×10): qty 50

## 2012-02-25 MED ORDER — ONDANSETRON HCL 4 MG/2ML IJ SOLN: 4.0000 mg | Freq: Three times a day (TID) | INTRAMUSCULAR | Status: DC | PRN

## 2012-02-25 MED ORDER — POTASSIUM CHLORIDE IN NACL 20-0.9 MEQ/L-% IV SOLN
INTRAVENOUS | Status: DC
  Administered 2012-02-25 – 2012-02-26 (×3): via INTRAVENOUS

## 2012-02-25 MED ORDER — ENOXAPARIN SODIUM 40 MG/0.4ML ~~LOC~~ SOLN
30.0000 mg | Freq: Every day | SUBCUTANEOUS | Status: DC
  Administered 2012-02-25: 30 mg via SUBCUTANEOUS
  Filled 2012-02-25 (×2): qty 0.4

## 2012-02-25 MED ORDER — ACETAMINOPHEN 325 MG PO TABS: 650.0000 mg | ORAL_TABLET | ORAL | Status: DC | PRN

## 2012-02-25 MED ORDER — DEXTROSE 5 % IV SOLN
1.0000 g | Freq: Once | INTRAVENOUS | Status: AC
  Administered 2012-02-25: 1 g via INTRAVENOUS
  Filled 2012-02-25: qty 10

## 2012-02-25 NOTE — Consult Note (Signed)
ANTIBIOTIC CONSULT NOTE - INITIAL  Pharmacy Consult for Vancomycin and Zosyn Indication: pyelonephritis  Allergies  Allergen Reactions  . Bactrim Other (See Comments)    Shaking, patient doesn't recall this allergy    Patient Measurements: Height: 5\' 9"  (175.3 cm) Weight: 144 lb 6.4 oz (65.5 kg) IBW/kg (Calculated) : 70.7   Vital Signs: Temp: 98.5 F (36.9 C) (04/19 1040) Temp src: Oral (04/19 1040) BP: 116/69 mmHg (04/19 1040) Pulse Rate: 82  (04/19 1040) Intake/Output from previous day: 04/18 0701 - 04/19 0700 In: 141.3 [I.V.:141.3] Out: 350 [Urine:350] Intake/Output from this shift: Total I/O In: -  Out: 225 [Urine:225]  Labs:  [facility] 02/25/12 0029  WBC 22.4*  HGB 12.3*  PLT 209  LABCREA --  CREATININE 1.17   Estimated Creatinine Clearance: 42 ml/min (by C-G formula based on Cr of 1.17). No results found for this basename: VANCOTROUGH:2,VANCOPEAK:2,VANCORANDOM:2,GENTTROUGH:2,GENTPEAK:2,GENTRANDOM:2,TOBRATROUGH:2,TOBRAPEAK:2,TOBRARND:2,AMIKACINPEAK:2,AMIKACINTROU:2,AMIKACIN:2, in the last 72 hours   Microbiology: No results found for this or any previous visit (from the past 720 hour(s)).  Medical History: Past Medical History  Diagnosis Date  . Diabetes mellitus   . Hypertension   . Hypercholesteremia   . Glaucoma    Medications:  Scheduled:    . acetaminophen  1,000 mg Oral Once  . aspirin EC  81 mg Oral Daily  . cefTRIAXone (ROCEPHIN) IVPB 1 gram/50 mL D5W  1 g Intravenous Once  . enoxaparin  30 mg Subcutaneous Daily  . insulin aspart  0-5 Units Subcutaneous QHS  . insulin aspart  0-9 Units Subcutaneous TID WC  . insulin glargine  4 Units Subcutaneous BH-q7a  . lisinopril  20 mg Oral Daily  . piperacillin-tazobactam (ZOSYN)  IV  3.375 g Intravenous Q8H  . simvastatin  20 mg Oral q1800  . sodium chloride  1,000 mL Intravenous Once  . Travoprost (BAK Free)  1 drop Both Eyes QPM  . vancomycin  1,000 mg Intravenous Q24H  . DISCONTD: sodium  chloride   Intravenous STAT  . DISCONTD: cefTRIAXone (ROCEPHIN)  IV  1 g Intravenous QHS  . DISCONTD: metFORMIN  1,000 mg Oral BID WC   Assessment: SCr OK, age related reduced renal fxn  Goal of Therapy:  Vancomycin trough level 10-15 mcg/ml Eradicate infection.  Plan: Zosyn 3.375gm iv q8hrs Vancomycin 1gm iv q24hrs Check trough at steady [id number], Bradley Reed 02/25/2012,11:45 AM

## 2012-02-25 NOTE — ED Provider Notes (Signed)
History     CSN: [id number]  Arrival date & time 02/25/12  0008   First MD Initiated Contact with Patient 02/25/12 0017      Chief Complaint  Patient presents with  . Fever    (Consider location/radiation/quality/duration/timing/severity/associated sxs/prior treatment) HPI Comments: 78 y/o male with hx of recent urinary tract infection, diabetes and hypertension. He was admitted to the hospital in March of this year and treated with intravenous antibiotics. He was discharged on Bactrim but had an allergic reaction and this was stopped. He states that over the last several days he's had a recurrence of dysuria, tonight started to have a fever. The symptoms are persistent, gradually worsening, not associated with rashes, diarrhea, shortness of breath or chest pain. He has had no medications prior to arrival. According to the family members his temperature was 102F oral prior to arrival.  Family doctor is Loran Senters  Patient is a 78 y.o. male presenting with fever. The history is provided by the patient, medical records and a relative.  Fever Primary symptoms of the febrile illness include fever.    Past Medical History  Diagnosis Date  . Diabetes mellitus   . Hypertension   . Hypercholesteremia   . Glaucoma     Past Surgical History  Procedure Date  . Leg surgery   . Tonsillectomy     No family history on file.  History  Substance Use Topics  . Smoking status: Former Smoker    Types: Cigarettes  . Smokeless tobacco: Never Used  . Alcohol Use: 1.8 oz/week    3 Glasses of wine per week      Review of Systems  Constitutional: Positive for fever.  All other systems reviewed and are negative.    Allergies  Bactrim  Home Medications   Current Outpatient Rx  Name Route Sig Dispense Refill  . ASPIRIN EC 81 MG PO TBEC Oral Take 81 mg by mouth daily.    Marland Kitchen CALCIUM CARBONATE-VITAMIN D 600-400 MG-UNIT PO TABS Oral Take 1 tablet by mouth 2 (two) times daily.        Marland Kitchen GLUCOSAMINE 500 MG PO CAPS Oral Take 1 capsule by mouth 3 (three) times daily.    Marland Kitchen LISINOPRIL 20 MG PO TABS Oral Take 20 mg by mouth every morning.     Marland Kitchen METFORMIN HCL 1000 MG PO TABS Oral Take 1,000 mg by mouth 2 (two) times daily with a meal.    . ICAPS AREDS FORMULA PO TABS Oral Take 1 tablet by mouth 2 (two) times daily.      Marland Kitchen PRAVASTATIN SODIUM 40 MG PO TABS Oral Take 40 mg by mouth every evening.    Marland Kitchen LUCENTIS IO Intraocular Inject 1 each into the eye every 8 (eight) weeks.    . TRAVOPROST 0.004 % OP SOLN Both Eyes Place 1 drop into both eyes every evening.     Marland Kitchen VITAMIN B-12 100 MCG PO TABS Oral Take 100 mcg by mouth 2 (two) times daily.      Marland Kitchen VITAMIN C 500 MG PO TABS Oral Take 500 mg by mouth 2 (two) times daily.       BP 113/78  Pulse 113  Temp(Src) 101.2 F (38.4 C) (Oral)  Resp 20  SpO2 94%  Physical Exam  Nursing note and vitals reviewed. Constitutional: He appears well-developed and well-nourished. No distress.  HENT:  Head: Normocephalic and atraumatic.  Mouth/Throat: Oropharynx is clear and moist. No oropharyngeal exudate.  Eyes: Conjunctivae and EOM are  normal. Pupils are equal, round, and reactive to light. Right eye exhibits no discharge. Left eye exhibits no discharge. No scleral icterus.  Neck: Normal range of motion. Neck supple. No JVD present. No thyromegaly present.  Cardiovascular: Regular rhythm, normal heart sounds and intact distal pulses.  Exam reveals no gallop and no friction rub.   No murmur heard.      Tachycardia to 120  Pulmonary/Chest: Effort normal. No respiratory distress. He has no wheezes. He has rales ( scattered rales at the bases that clear with deep breathing and coughing. No increased work of breathing, no wheezing).  Abdominal: Soft. Bowel sounds are normal. He exhibits no distension and no mass. There is no tenderness.  Musculoskeletal: Normal range of motion. He exhibits no edema and no tenderness.  Lymphadenopathy:    He has no  cervical adenopathy.  Neurological: He is alert. Coordination normal.  Skin: Skin is warm and dry. No rash noted. No erythema.  Psychiatric: He has a normal mood and affect. His behavior is normal.    ED Course  Procedures (including critical care time)  Labs Reviewed  URINALYSIS, WITH MICROSCOPIC - Abnormal; Notable for the following:    APPearance HAZY (*)    Hgb urine dipstick MODERATE (*)    Ketones, ur TRACE (*)    Protein, ur TRACE (*)    Nitrite POSITIVE (*)    Leukocytes, UA LARGE (*)    Bacteria, UA MANY (*)    All other components within normal limits  CBC - Abnormal; Notable for the following:    WBC 22.4 (*)    RBC 3.72 (*)    Hemoglobin 12.3 (*)    HCT 35.7 (*)    All other components within normal limits  DIFFERENTIAL - Abnormal; Notable for the following:    Neutrophils Relative 88 (*)    Neutro Abs 20.0 (*)    Lymphocytes Relative 4 (*)    Monocytes Absolute 1.5 (*)    All other components within normal limits  BASIC METABOLIC PANEL - Abnormal; Notable for the following:    Sodium 134 (*)    Glucose, Bld 145 (*)    BUN 24 (*)    GFR calc non Af Amer 55 (*)    GFR calc Af Amer 63 (*)    All other components within normal limits  LACTIC ACID, PLASMA  URINE CULTURE   No results found.   1. Sepsis   2. Urosepsis       MDM  There is no abdominal tenderness but he does have a fever of 103 at a tachycardia. This may be a recurrent urinary infection or pyelonephritis. Labs ordered, fever medicines given, reevaluate.  The patient has had an elevated white blood cell count at over 20,000, fever of 103 and tachycardia. Due to this I believe that the patient is septic from a urinary source however he is not in shock. He has improved somewhat with medications, but lower choir admission to the hospital for ongoing treatment for his infection. Urine culture drawn, patient reevaluated several times, informed of the indications for admission and agreeable with the  same.   Discussed care with hospitalist who will admit to the hospital - tele bed ordered  CRITICAL CARE Performed by: Vida Roller   Total critical care time: 35  Critical care time was exclusive of separately billable procedures and treating other patients.  Critical care was necessary to treat or prevent imminent or life-threatening deterioration.  Critical care was time spent  personally by me on the following activities: development of treatment plan with patient and/or surrogate as well as nursing, discussions with consultants, evaluation of patient's response to treatment, examination of patient, obtaining history from patient or surrogate, ordering and performing treatments and interventions, ordering and review of laboratory studies, ordering and review of radiographic studies, pulse oximetry and re-evaluation of patient's condition.   Vida Roller, MD 02/25/12 [phone]

## 2012-02-25 NOTE — ED Notes (Signed)
Per dr Orvan Falconer wants a post void bladder scan.

## 2012-02-25 NOTE — ED Notes (Signed)
Pt states woke up hot this morning. Still has some pain w/ urination.

## 2012-02-25 NOTE — Progress Notes (Signed)
I saw and examined Bradley Reed at bed side. I reviewed his chart. He has had recurrent uti, wonder if he may have an obstructive process. Will broaden abx, obtain ct abdomen/ pelvis, check psa, hold metformin in view of contrast. Please see rest of plan per Dr Orvan Falconer.  Love Milbourne,MD pager#[phone].

## 2012-02-25 NOTE — H&P (Signed)
PCP:   Harlow Asa, MD, MD   Chief Complaint:  Weakness fever and dysuria  HPI: Bradley Reed is an 78 y.o. male.  Diabetes, hypertension; no history of BPH or other urinary tract problem, until 3 weeks ago when he was admitted with metabolic encephalopathy related to a urinary tract infection. The encephalopathy quickly resolved, and his urine culture was positive for pansensitive Escherichia coli, and he was discharged home on a course of Bactrim. The Bactrim was recently completed, but his dysuria has never fully resolved. Yesterday while working out in the garden he felt progressively weak, and had to go inside to rest; during the night his wife felt that he was very hot and measured his temperature to 102, total brought him to the emergency room.  In the ER, patient is weak looking, his white count is 22,000, similar to his discharge value, and his urinalysis is suggestive of a urinary tract infection.  Rewiew of Systems:  The patient denies anorexia, eight loss,, vision loss, decreased hearing, hoarseness, chest pain, syncope, dyspnea on exertion, peripheral edema, balance deficits, hemoptysis, abdominal pain, melena, hematochezia, severe indigestion/heartburn, hematuria, incontinence, genital sores, muscle weakness, suspicious skin lesions, transient blindness, difficulty walking, depression, unusual weight change, abnormal bleeding, enlarged lymph nodes, angioedema, and breast masses.    Past Medical History  Diagnosis Date  . Diabetes mellitus   . Hypertension   . Hypercholesteremia   . Glaucoma     Past Surgical History  Procedure Date  . Leg surgery   . Tonsillectomy     Medications:  HOME MEDS: Prior to Admission medications   Medication Sig Start Date End Date Taking? Authorizing Provider  aspirin EC 81 MG tablet Take 81 mg by mouth daily.   Yes Historical Provider, MD  Calcium Carbonate-Vitamin D (CALCIUM 600+D) 600-400 MG-UNIT per tablet Take 1 tablet by mouth 2  (two) times daily.     Yes Historical Provider, MD  Glucosamine 500 MG CAPS Take 1 capsule by mouth 3 (three) times daily.   Yes Historical Provider, MD  lisinopril (PRINIVIL,ZESTRIL) 20 MG tablet Take 20 mg by mouth every morning.    Yes Historical Provider, MD  metFORMIN (GLUCOPHAGE) 1000 MG tablet Take 1,000 mg by mouth 2 (two) times daily with a meal.   Yes Historical Provider, MD  Multiple Vitamins-Minerals (ICAPS) TABS Take 1 tablet by mouth 2 (two) times daily.     Yes Historical Provider, MD  pravastatin (PRAVACHOL) 40 MG tablet Take 40 mg by mouth every evening.   Yes Historical Provider, MD  Ranibizumab (LUCENTIS IO) Inject 1 each into the eye every 8 (eight) weeks.   Yes Historical Provider, MD  travoprost, benzalkonium, (TRAVATAN) 0.004 % ophthalmic solution Place 1 drop into both eyes every evening.    Yes Historical Provider, MD  vitamin B-12 (CYANOCOBALAMIN) 100 MCG tablet Take 100 mcg by mouth 2 (two) times daily.     Yes Historical Provider, MD  vitamin C (ASCORBIC ACID) 500 MG tablet Take 500 mg by mouth 2 (two) times daily.    Yes Historical Provider, MD     Allergies:  Allergies  Allergen Reactions  . Bactrim Other (See Comments)    shacking    Social History:   reports that he has quit smoking. His smoking use included Cigarettes. He has never used smokeless tobacco. He reports that he drinks about 1.8 ounces of alcohol per week. He reports that he does not use illicit drugs.  Family History: History reviewed. No pertinent  family history.   Physical Exam: Filed Vitals:   02/25/12 0024 02/25/12 0214 02/25/12 0321  BP: 144/90 113/78 115/72  Pulse: 125 113 99  Temp: 103 F (39.4 C) 101.2 F (38.4 C) 98.2 F (36.8 C)  TempSrc: Rectal Oral Oral  Resp: 20 20 20   Height:   5\' 9"  (1.753 m)  Weight:   65.2 kg (143 lb 11.8 oz)  SpO2: 94% 94% 96%   Blood pressure 115/72, pulse 99, temperature 98.2 F (36.8 C), temperature source Oral, resp. rate 20, height 5\' 9"   (1.753 m), weight 65.2 kg (143 lb 11.8 oz), SpO2 96.00%.  GEN:  Pleasant, smiling but weak looking elderly Caucasian gentleman lying in the stretcher in no acute distress; cooperative with exam PSYCH:  alert and oriented x4; does not appear anxious or depressed; affect is appropriate. HEENT: Mucous membranes pink and anicteric; PERRLA; EOM intact; no cervical lymphadenopathy nor thyromegaly or carotid bruit; no JVD; Breasts:: Not examined CHEST WALL: No tenderness CHEST: Normal respiration, clear to auscultation bilaterally HEART: Tachycardic; no murmurs or rubs; S4 gallop BACK: Mild kyphosis no scoliosis; no CVA tenderness ABDOMEN: Scaphoid soft non-tender; no masses, no organomegaly, normal abdominal bowel sounds;  Rectal Exam: Not done EXTREMITIES: ; age-appropriate arthropathy of the hands and knees; no edema; no ulcerations. Genitalia: not examined PULSES: 2+ and symmetric SKIN: Normal hydration no rash or ulceration CNS: Cranial nerves 2-12 grossly intact no focal lateralizing neurologic deficit   Labs & Imaging Results for orders placed during the hospital encounter of 02/25/12 (from the past 48 hour(s))  CBC     Status: Abnormal   Collection Time   02/25/12 12:29 AM      Component Value Range Comment   WBC 22.4 (*) 4.0 - 10.5 (K/uL)    RBC 3.72 (*) 4.22 - 5.81 (MIL/uL)    Hemoglobin 12.3 (*) 13.0 - 17.0 (g/dL)    HCT 40.3 (*) 47.4 - 52.0 (%)    MCV 96.0  78.0 - 100.0 (fL)    MCH 33.1  26.0 - 34.0 (pg)    MCHC 34.5  30.0 - 36.0 (g/dL)    RDW 25.9  56.3 - 87.5 (%)    Platelets 209  150 - 400 (K/uL)   DIFFERENTIAL     Status: Abnormal   Collection Time   02/25/12 12:29 AM      Component Value Range Comment   Neutrophils Relative 88 (*) 43 - 77 (%)    Neutro Abs 20.0 (*) 1.7 - 7.7 (K/uL)    Lymphocytes Relative 4 (*) 12 - 46 (%)    Lymphs Abs 0.8  0.7 - 4.0 (K/uL)    Monocytes Relative 7  3 - 12 (%)    Monocytes Absolute 1.5 (*) 0.1 - 1.0 (K/uL)    Eosinophils Relative 1   0 - 5 (%)    Eosinophils Absolute 0.1  0.0 - 0.7 (K/uL)    Basophils Relative 0  0 - 1 (%)    Basophils Absolute 0.0  0.0 - 0.1 (K/uL)   BASIC METABOLIC PANEL     Status: Abnormal   Collection Time   02/25/12 12:29 AM      Component Value Range Comment   Sodium 134 (*) 135 - 145 (mEq/L)    Potassium 3.7  3.5 - 5.1 (mEq/L)    Chloride 97  96 - 112 (mEq/L)    CO2 25  19 - 32 (mEq/L)    Glucose, Bld 145 (*) 70 - 99 (mg/dL)  BUN 24 (*) 6 - 23 (mg/dL)    Creatinine, Ser 9.14  0.50 - 1.35 (mg/dL)    Calcium 78.2  8.4 - 10.5 (mg/dL)    GFR calc non Af Amer 55 (*) >90 (mL/min)    GFR calc Af Amer 63 (*) >90 (mL/min)   LACTIC ACID, PLASMA     Status: Normal   Collection Time   02/25/12 12:30 AM      Component Value Range Comment   Lactic Acid, Venous 1.9  0.5 - 2.2 (mmol/L)   URINALYSIS, WITH MICROSCOPIC     Status: Abnormal   Collection Time   02/25/12 12:52 AM      Component Value Range Comment   Color, Urine YELLOW  YELLOW     APPearance HAZY (*) CLEAR     Specific Gravity, Urine 1.010  1.005 - 1.030     pH 7.0  5.0 - 8.0     Glucose, UA NEGATIVE  NEGATIVE (mg/dL)    Hgb urine dipstick MODERATE (*) NEGATIVE     Bilirubin Urine NEGATIVE  NEGATIVE     Ketones, ur TRACE (*) NEGATIVE (mg/dL)    Protein, ur TRACE (*) NEGATIVE (mg/dL)    Urobilinogen, UA 0.2  0.0 - 1.0 (mg/dL)    Nitrite POSITIVE (*) NEGATIVE     Leukocytes, UA LARGE (*) NEGATIVE     WBC, UA TOO NUMEROUS TO COUNT  <3 (WBC/hpf)    RBC / HPF 0-2  <3 (RBC/hpf)    Bacteria, UA MANY (*) RARE     Squamous Epithelial / LPF RARE  RARE     No results found.    Assessment Present on Admission:  .SIRS (systemic inflammatory response syndrome) .UTI (lower urinary tract infection) .HTN (hypertension) .DM type 2 (diabetes mellitus, type 2) .Glaucoma .Hypercholesteremia   PLAN: Admit this gentleman for hydration treatment of his urinary tract infection; we notice he is now reporting allergy to Bactrim and we will start  on Rocephin pending cultures. We need to  investigate for a predisposing cause for these urinary tract infection; will start with post void residual.  Other plans as per orders.   Finola Rosal 02/25/2012, 4:05 AM

## 2012-02-25 NOTE — ED Notes (Signed)
Pt states he woke tonight & was hot all over. Wife states she took temp & was 102. No meds given before coming to the er. Pt was admitted to the hospital 3 or 4 weeks ago for a UTI.

## 2012-02-26 LAB — COMPREHENSIVE METABOLIC PANEL
ALT: 11 U/L (ref 0–53)
AST: 16 U/L (ref 0–37)
CO2: 26 mEq/L (ref 19–32)
Calcium: 9.2 mg/dL (ref 8.4–10.5)
Chloride: 104 mEq/L (ref 96–112)
GFR calc non Af Amer: 54 mL/min — ABNORMAL LOW (ref >90)
Sodium: 138 mEq/L (ref 135–145)

## 2012-02-26 LAB — CBC
Platelets: 203 10*3/uL (ref 150–400)
RBC: 3.31 MIL/uL — ABNORMAL LOW (ref 4.22–5.81)
WBC: 14.7 10*3/uL — ABNORMAL HIGH (ref 4.0–10.5)

## 2012-02-26 LAB — GLUCOSE, CAPILLARY
Glucose-Capillary: 85 mg/dL (ref 70–99)
Glucose-Capillary: 138 mg/dL — ABNORMAL HIGH (ref 70–99)

## 2012-02-26 MED ORDER — ENOXAPARIN SODIUM 40 MG/0.4ML ~~LOC~~ SOLN
40.0000 mg | Freq: Every day | SUBCUTANEOUS | Status: DC
  Administered 2012-02-26 – 2012-02-27 (×2): 40 mg via SUBCUTANEOUS
  Filled 2012-02-26: qty 0.4

## 2012-02-26 NOTE — Progress Notes (Signed)
Patient has rested well during the night with no complaints.

## 2012-02-26 NOTE — Progress Notes (Signed)
Subjective: Feeling better, still has some dysuria.    Objective: Vital signs in last 24 hours: Temp:  [97.8 F (36.6 C)-98.1 F (36.7 C)] 97.8 F (36.6 C) (04/20 1438) Pulse Rate:  [73-79] 73  (04/20 1438) Resp:  [20] 20  (04/20 1438) BP: (115-125)/(70-75) 122/74 mmHg (04/20 1438) SpO2:  [95 %-96 %] 95 % (04/20 1438) Weight:  [65.2 kg (143 lb 11.8 oz)] 65.2 kg (143 lb 11.8 oz) (04/20 0450) Weight change: 0 kg (0 lb) Last BM Date: 02/23/12  Intake/Output from previous day: 04/19 0701 - 04/20 0700 In: -  Out: 2675 [Urine:2675] Total I/O In: -  Out: 800 [Urine:800]   Physical Exam: General: Alert, awake, oriented x3, in no acute distress. HEENT: No bruits, no goiter. Heart: Regular rate and rhythm, without murmurs, rubs, gallops. Lungs: Clear to auscultation bilaterally. Abdomen: Soft, nontender, nondistended, positive bowel sounds. Extremities: No clubbing cyanosis or edema with positive pedal pulses. Neuro: Grossly intact, nonfocal.    Lab Results: Basic Metabolic Panel:  Basename 02/26/12 0619 02/25/12 0029  NA 138 134*  K 3.7 3.7  CL 104 97  CO2 26 25  GLUCOSE 82 145*  BUN 14 24*  CREATININE 1.18 1.17  CALCIUM 9.2 10.1  MG -- --  PHOS -- --   Liver Function Tests:  [facility] 02/26/12 0619 02/25/12 0350  AST 16 19  ALT 11 14  ALKPHOS 57 70  BILITOT 0.7 0.5  PROT 6.5 7.5  ALBUMIN 3.1* 3.8   No results found for this basename: LIPASE:2,AMYLASE:2 in the last 72 hours No results found for this basename: AMMONIA:2 in the last 72 hours CBC:  Basename 02/26/12 0619 02/25/12 0029  WBC 14.7* 22.4*  NEUTROABS -- 20.0*  HGB 11.0* 12.3*  HCT 32.3* 35.7*  MCV 97.6 96.0  PLT 203 209   Cardiac Enzymes: No results found for this basename: CKTOTAL:3,CKMB:3,CKMBINDEX:3,TROPONINI:3 in the last 72 hours BNP: No results found for this basename: PROBNP:3 in the last 72 hours D-Dimer: No results found for this basename: DDIMER:2 in the last 72  hours CBG:  Basename 02/26/12 1100 02/26/12 0744 02/25/12 2033 02/25/12 1653 02/25/12 1147 02/25/12 0816  GLUCAP 138* 85 101* 120* 62* 151*   Hemoglobin A1C:  Basename 02/25/12 0350  HGBA1C 5.9*   Fasting Lipid Panel: No results found for this basename: CHOL,HDL,LDLCALC,TRIG,CHOLHDL,LDLDIRECT in the last 72 hours Thyroid Function Tests: No results found for this basename: TSH,T4TOTAL,FREET4,T3FREE,THYROIDAB in the last 72 hours Anemia Panel: No results found for this basename: VITAMINB12,FOLATE,FERRITIN,TIBC,IRON,RETICCTPCT in the last 72 hours Coagulation: No results found for this basename: LABPROT:2,INR:2 in the last 72 hours Urine Drug Screen: Drugs of Abuse  No results found for this basename: labopia, cocainscrnur, labbenz, amphetmu, thcu, labbarb    Alcohol Level: No results found for this basename: ETH:2 in the last 72 hours Urinalysis:  Basename 02/25/12 0052  COLORURINE YELLOW  LABSPEC 1.010  PHURINE 7.0  GLUCOSEU NEGATIVE  HGBUR MODERATE*  BILIRUBINUR NEGATIVE  KETONESUR TRACE*  PROTEINUR TRACE*  UROBILINOGEN 0.2  NITRITE POSITIVE*  LEUKOCYTESUR LARGE*    Recent Results (from the past 240 hour(s))  URINE CULTURE     Status: Normal (Preliminary result)   Collection Time   02/25/12 12:52 AM      Component Value Range Status Comment   Specimen Description URINE, CLEAN CATCH   Final    Special Requests NONE   Final    Culture  Setup Time [id number]   Final    Colony Count >=100,000 COLONIES/ML  Final    Culture ESCHERICHIA COLI   Final    Report Status PENDING   Incomplete     Studies/Results: Ct Abdomen Pelvis W Contrast  02/25/2012  *RADIOLOGY REPORT*  Clinical Data: Recurrent urinary tract infections  CT ABDOMEN AND PELVIS WITH CONTRAST  Technique:  Multidetector CT imaging of the abdomen and pelvis was performed following the standard protocol during bolus administration of intravenous contrast.  Contrast: OMNIPAQUE IOHEXOL 300 MG/ML  SOLN   Comparison: CT 03/06/2008  Findings: Lung bases are clear. Chronic elevation right hemidiaphragm. There is a shelf-like reflection of the diaphragm posteriorly on the right which could represent a remote diaphragmatic injury.  This is stable over comparison exams.  The No focal hepatic lesion.  The gallbladder, pancreas, spleen, adrenal glands are normal.  No evidence of obstructive uropathy. Simple cyst in the right kidney.  The stomach, small bowel, and cecum are normal.  The colon and rectosigmoid colon are normal.  There is a moderate volume stool throughout the colon.  Abdominal aorta normal caliber.  No retroperitoneal periportal lymphadenopathy.  No free fluid the pelvis.  The prostate gland bladder normal.  No pelvic lymphadenopathy. Review of  bone windows demonstrates no aggressive osseous lesions.  IMPRESSION:  1.  No acute abdominal pelvic findings. 2.  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  Original Report Authenticated By: Genevive Bi, M.D.    Medications: Scheduled Meds:   . aspirin EC  81 mg Oral Daily  . enoxaparin (LOVENOX) injection  40 mg Subcutaneous Daily  . insulin aspart  0-5 Units Subcutaneous QHS  . insulin aspart  0-9 Units Subcutaneous TID WC  . insulin glargine  4 Units Subcutaneous BH-q7a  . lisinopril  20 mg Oral Daily  . piperacillin-tazobactam (ZOSYN)  IV  3.375 g Intravenous Q8H  . simvastatin  20 mg Oral q1800  . Travoprost (BAK Free)  1 drop Both Eyes QPM  . vancomycin  1,000 mg Intravenous Q24H  . DISCONTD: enoxaparin  30 mg Subcutaneous Daily   Continuous Infusions:   . 0.9 % NaCl with KCl 20 mEq / L 75 mL/hr at 02/26/12 1218   PRN Meds:.acetaminophen, acetaminophen, iohexol, ondansetron (ZOFRAN) IV, ondansetron, traZODone  Assessment/Plan:  Active Problems:  UTI (lower urinary tract infection)  HTN (hypertension)  DM type 2 (diabetes mellitus, type 2)  Glaucoma  Hypercholesteremia  SIRS (systemic inflammatory  response syndrome)  1. E coli UTI, present on admission, recurrent.  Patient has clinically improved on empiric antibiotics.  Due to the recurrent nature of his infection, there is risk for a resistant infection. I think it is necessary to identify sensitivities to adjust appropriate oral antibiotics. Currently sensitivities are pending. Will discontinue vancomycin as it is likely not adding much to coverage. I have also advised the patient to see a urologist as an outpatient to further discuss his recurrent UTI  2. DM2, stable  3. HTN, stable  4. Dehydration, improved  5. Dispo.  Hopeful for discharge tomorrow once sensitivities are available   LOS: 1 day   Lennyn Gange [facility] Pager: [phone] 02/26/2012, 2:48 PM

## 2012-02-27 LAB — GLUCOSE, CAPILLARY: Glucose-Capillary: 90 mg/dL (ref 70–99)

## 2012-02-27 LAB — URINE CULTURE: Culture  Setup Time: 201304190240

## 2012-02-27 LAB — CBC
HCT: 32.9 % — ABNORMAL LOW (ref 39.0–52.0)
Hemoglobin: 11.2 g/dL — ABNORMAL LOW (ref 13.0–17.0)
MCV: 97.3 fL (ref 78.0–100.0)
RBC: 3.38 MIL/uL — ABNORMAL LOW (ref 4.22–5.81)
RDW: 13.9 % (ref 11.5–15.5)
WBC: 7.9 10*3/uL (ref 4.0–10.5)

## 2012-02-27 LAB — BASIC METABOLIC PANEL
CO2: 28 mEq/L (ref 19–32)
Chloride: 102 mEq/L (ref 96–112)
GFR calc Af Amer: 61 mL/min — ABNORMAL LOW (ref >90)
Potassium: 3.6 mEq/L (ref 3.5–5.1)
Sodium: 139 mEq/L (ref 135–145)

## 2012-02-27 MED ORDER — AMOXICILLIN-POT CLAVULANATE 875-125 MG PO TABS: 1.0000 | ORAL_TABLET | Freq: Two times a day (BID) | ORAL | Status: AC

## 2012-02-27 NOTE — Discharge Summary (Signed)
Physician Discharge Summary  Patient ID: LOVIS MORE MRN: [id number] DOB/AGE: 1924/04/03 78 y.o.  Admit date: 02/25/2012 Discharge date: 02/27/2012  Primary Care Physician:  Harlow Asa, MD, MD   Discharge Diagnoses:    Active Problems:  E coli UTI (lower urinary tract infection), present on admission  HTN (hypertension)  DM type 2 (diabetes mellitus, type 2)  Glaucoma  Hypercholesteremia  Sepsis, resolved Dehydration Leukocytosis  Medication List  As of 02/27/2012 10:52 AM   TAKE these medications         amoxicillin-clavulanate 875-125 MG per tablet   Commonly known as: AUGMENTIN   Take 1 tablet by mouth 2 (two) times daily.      aspirin EC 81 MG tablet   Take 81 mg by mouth daily.      Calcium 600+D 600-400 MG-UNIT per tablet   Generic drug: Calcium Carbonate-Vitamin D   Take 1 tablet by mouth 2 (two) times daily.      Glucosamine 500 MG Caps   Take 1 capsule by mouth 3 (three) times daily.      lisinopril 20 MG tablet   Commonly known as: PRINIVIL,ZESTRIL   Take 20 mg by mouth every morning.      LUCENTIS IO   Inject 1 each into the eye every 8 (eight) weeks.      metFORMIN 1000 MG tablet   Commonly known as: GLUCOPHAGE   Take 1,000 mg by mouth 2 (two) times daily with a meal.      pravastatin 40 MG tablet   Commonly known as: PRAVACHOL   Take 40 mg by mouth every evening.      travoprost (benzalkonium) 0.004 % ophthalmic solution   Commonly known as: TRAVATAN   Place 1 drop into both eyes every evening.      vitamin B-12 100 MCG tablet   Commonly known as: CYANOCOBALAMIN   Take 100 mcg by mouth 2 (two) times daily.      vitamin C 500 MG tablet   Commonly known as: ASCORBIC ACID   Take 500 mg by mouth 2 (two) times daily.           Discharge Exam: Blood pressure 130/78, pulse 75, temperature 97.9 F (36.6 C), temperature source Oral, resp. rate 20, height 5\' 9"  (1.753 m), weight 64.5 kg (142 lb 3.2 oz), SpO2 98.00%. NAD CTA B S1, S2,  RRR Soft, NT, BS+ No edema b/l  Disposition and Follow-up:  Follow up with pcp in 1-2 weeks Outpatient consultation with urology recommended  Consults: none   Significant Diagnostic Studies:  Ct Abdomen Pelvis W Contrast  02/25/2012  *RADIOLOGY REPORT*  Clinical Data: Recurrent urinary tract infections  CT ABDOMEN AND PELVIS WITH CONTRAST  Technique:  Multidetector CT imaging of the abdomen and pelvis was performed following the standard protocol during bolus administration of intravenous contrast.  Contrast: OMNIPAQUE IOHEXOL 300 MG/ML  SOLN  Comparison: CT 03/06/2008  Findings: Lung bases are clear. Chronic elevation right hemidiaphragm. There is a shelf-like reflection of the diaphragm posteriorly on the right which could represent a remote diaphragmatic injury.  This is stable over comparison exams.  The No focal hepatic lesion.  The gallbladder, pancreas, spleen, adrenal glands are normal.  No evidence of obstructive uropathy. Simple cyst in the right kidney.  The stomach, small bowel, and cecum are normal.  The colon and rectosigmoid colon are normal.  There is a moderate volume stool throughout the colon.  Abdominal aorta normal caliber.  No retroperitoneal periportal  lymphadenopathy.  No free fluid the pelvis.  The prostate gland bladder normal.  No pelvic lymphadenopathy. Review of  bone windows demonstrates no aggressive osseous lesions.  IMPRESSION:  1.  No acute abdominal pelvic findings. 2.  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  Original Report Authenticated By: Genevive Bi, M.D.    Brief H and P: For complete details please refer to admission H and P, but in brief MELCHIZEDEK ESPINOLA is an 78 y.o. male. Diabetes, hypertension; no history of BPH or other urinary tract problem, until 3 weeks ago when he was admitted with metabolic encephalopathy related to a urinary tract infection. The encephalopathy quickly resolved, and his urine culture was  positive for pansensitive Escherichia coli, and he was discharged home on a course of Bactrim. The Bactrim was recently completed, but his dysuria has never fully resolved. Yesterday while working out in the garden he felt progressively weak, and had to go inside to rest; during the night his wife felt that he was very hot and measured his temperature to 102, total brought him to the emergency room.  In the ER, patient is weak looking, his white count is 22,000, similar to his discharge value, and his urinalysis is suggestive of a urinary tract infection.   Hospital Course:  This is a very pleasant 78 y/o male who was admitted to the hospital with weakness and fever.  He was found to have a recurrent urinary tract infection and early sepsis.  He was started on empiric abx and quickly improved.  His wbc count has normalized, he no longer has fevers and feels back to baseline.  He denies any nocturia, urinary hesitancy difficulty initiating urine stream.  Urine cultures were positive for pansensitive E coli.  He will be transitioned to augmentin to complete his course.  Due to the recurrent nature of his infection, he has been recommended to see a urologist in the outpatient setting.  He says he will pursue this with his primary doctor.   He is stable for discharge  Time spent on Discharge:  Signed: Karlita Lichtman [facility] Pager: [phone] 02/27/2012, 10:52 AM

## 2012-02-27 NOTE — Discharge Instructions (Signed)

## 2012-02-27 NOTE — Progress Notes (Signed)
Patient given discharge instructions with no questions. Prescriptions given. Told to make f/u appointment with PCP in AM. Left in stable condition with wife.

## 2012-03-21 ENCOUNTER — Ambulatory Visit (INDEPENDENT_AMBULATORY_CARE_PROVIDER_SITE_OTHER): Payer: Medicare Other | Admitting: Urology

## 2012-03-21 DIAGNOSIS — N320 Bladder-neck obstruction: Secondary | ICD-10-CM

## 2012-03-21 DIAGNOSIS — N410 Acute prostatitis: Secondary | ICD-10-CM

## 2012-03-22 NOTE — Progress Notes (Signed)
UR chart review completed. Bradley Reed  

## 2012-06-25 ENCOUNTER — Encounter (HOSPITAL_COMMUNITY): Payer: Self-pay | Admitting: *Deleted

## 2012-06-25 ENCOUNTER — Other Ambulatory Visit: Payer: Self-pay

## 2012-06-25 ENCOUNTER — Emergency Department (HOSPITAL_COMMUNITY)
Admission: EM | Admit: 2012-06-25 | Discharge: 2012-06-25 | Disposition: A | Discharge status: Home / Self Care | Payer: Medicare Other | Attending: Emergency Medicine | Admitting: Emergency Medicine

## 2012-06-25 DIAGNOSIS — Z881 Allergy status to other antibiotic agents status: Secondary | ICD-10-CM | POA: Insufficient documentation

## 2012-06-25 DIAGNOSIS — I10 Essential (primary) hypertension: Secondary | ICD-10-CM | POA: Insufficient documentation

## 2012-06-25 DIAGNOSIS — Z7982 Long term (current) use of aspirin: Secondary | ICD-10-CM | POA: Insufficient documentation

## 2012-06-25 DIAGNOSIS — H409 Unspecified glaucoma: Secondary | ICD-10-CM | POA: Insufficient documentation

## 2012-06-25 DIAGNOSIS — E119 Type 2 diabetes mellitus without complications: Secondary | ICD-10-CM | POA: Insufficient documentation

## 2012-06-25 DIAGNOSIS — R9431 Abnormal electrocardiogram [ECG] [EKG]: Secondary | ICD-10-CM | POA: Insufficient documentation

## 2012-06-25 DIAGNOSIS — E7800 Pure hypercholesterolemia, unspecified: Secondary | ICD-10-CM | POA: Insufficient documentation

## 2012-06-25 DIAGNOSIS — Z87891 Personal history of nicotine dependence: Secondary | ICD-10-CM | POA: Insufficient documentation

## 2012-06-25 LAB — CBC
MCV: 95.1 fL (ref 78.0–100.0)
Platelets: 196 10*3/uL (ref 150–400)
RBC: 3.86 MIL/uL — ABNORMAL LOW (ref 4.22–5.81)
RDW: 13.1 % (ref 11.5–15.5)
WBC: 6.6 10*3/uL (ref 4.0–10.5)

## 2012-06-25 LAB — BASIC METABOLIC PANEL
Calcium: 10 mg/dL (ref 8.4–10.5)
Chloride: 96 mEq/L (ref 96–112)
Creatinine, Ser: 1 mg/dL (ref 0.50–1.35)
GFR calc Af Amer: 76 mL/min — ABNORMAL LOW (ref >90)
Sodium: 133 mEq/L — ABNORMAL LOW (ref 135–145)

## 2012-06-25 MED ORDER — METOPROLOL TARTRATE 50 MG PO TABS
25.0000 mg | ORAL_TABLET | Freq: Once | ORAL | Status: AC
  Administered 2012-06-25: 25 mg via ORAL
  Filled 2012-06-25: qty 1

## 2012-06-25 NOTE — ED Notes (Signed)
Pt started feeling "bad" a couple days ago. Pt unable to describe how he feels except that he feels "bad." Denies headache or weakness. Pt states his blood pressure is elevated.

## 2012-06-25 NOTE — ED Provider Notes (Signed)
History     CSN: [id number]  Arrival date & time 06/25/12  0124   First MD Initiated Contact with Patient 06/25/12 [phone]      Chief Complaint  Patient presents with  . Hypertension    (Consider location/radiation/quality/duration/timing/severity/associated sxs/prior treatment) HPI Hx per PT, noticed elevated BP 3 days ago, followed by Dr Gerda Diss, takes lisinopril "for years", no recent medication changes. No CP or SOB. No HA or unilateral weakness. No ABD pain or N/V. No trouble urinating. Mod in severity, no known aggrevating or alleviating factors.  Past Medical History  Diagnosis Date  . Diabetes mellitus   . Hypertension   . Hypercholesteremia   . Glaucoma     Past Surgical History  Procedure Date  . Leg surgery   . Tonsillectomy     History reviewed. No pertinent family history.  History  Substance Use Topics  . Smoking status: Former Smoker    Types: Cigarettes  . Smokeless tobacco: Never Used  . Alcohol Use: 0.0 oz/week     1 drink a month      Review of Systems  Constitutional: Negative for fever and chills.  HENT: Negative for neck pain and neck stiffness.   Eyes: Negative for pain.  Respiratory: Negative for shortness of breath.   Cardiovascular: Negative for chest pain.  Gastrointestinal: Negative for abdominal pain.  Genitourinary: Negative for dysuria.  Musculoskeletal: Negative for back pain.  Skin: Negative for rash.  Neurological: Negative for headaches.  All other systems reviewed and are negative.    Allergies  Bactrim  Home Medications   Current Outpatient Rx  Name Route Sig Dispense Refill  . ASPIRIN EC 81 MG PO TBEC Oral Take 81 mg by mouth daily.    Marland Kitchen CALCIUM CARBONATE-VITAMIN D 600-400 MG-UNIT PO TABS Oral Take 1 tablet by mouth 2 (two) times daily.      Marland Kitchen GLUCOSAMINE 500 MG PO CAPS Oral Take 1 capsule by mouth 3 (three) times daily.    Marland Kitchen LISINOPRIL 20 MG PO TABS Oral Take 20 mg by mouth every morning.     Marland Kitchen METFORMIN HCL 1000  MG PO TABS Oral Take 1,000 mg by mouth 2 (two) times daily with a meal.    . VITEYES AREDS FORMULA PO Oral Take by mouth.    Marland Kitchen PRAVASTATIN SODIUM 40 MG PO TABS Oral Take 40 mg by mouth every evening.    Marland Kitchen LUCENTIS IO Intraocular Inject 1 each into the eye every 8 (eight) weeks.    . TRAVOPROST 0.004 % OP SOLN Both Eyes Place 1 drop into both eyes every evening.     Marland Kitchen VITAMIN B-12 100 MCG PO TABS Oral Take 100 mcg by mouth 2 (two) times daily.      Marland Kitchen VITAMIN C 500 MG PO TABS Oral Take 500 mg by mouth 2 (two) times daily.       BP 193/94  Pulse 76  Temp 97.6 F (36.4 C) (Oral)  Resp 22  Ht 5\' 9"  (1.753 m)  Wt 139 lb (63.05 kg)  BMI 20.53 kg/m2  SpO2 97%  Physical Exam  Constitutional: He is oriented to person, place, and time. He appears well-developed and well-nourished.  HENT:  Head: Normocephalic and atraumatic.  Eyes: Conjunctivae and EOM are normal. Pupils are equal, round, and reactive to light.  Neck: Trachea normal. Neck supple. No thyromegaly present.  Cardiovascular: Normal rate, regular rhythm, S1 normal, S2 normal and normal pulses.     No systolic murmur is present  No diastolic murmur is present  Pulses:      Radial pulses are 2+ on the right side, and 2+ on the left side.  Pulmonary/Chest: Effort normal and breath sounds normal. He has no wheezes. He has no rhonchi. He has no rales. He exhibits no tenderness.  Abdominal: Soft. Normal appearance and bowel sounds are normal. There is no tenderness. There is no CVA tenderness and negative Murphy's sign.  Musculoskeletal:       BLE:s Calves nontender, no cords or erythema, negative Homans sign  Neurological: He is alert and oriented to person, place, and time. He has normal strength. No cranial nerve deficit or sensory deficit. GCS eye subscore is 4. GCS verbal subscore is 5. GCS motor subscore is 6.  Skin: Skin is warm and dry. No rash noted. He is not diaphoretic.  Psychiatric: His speech is normal.       Cooperative  and appropriate    ED Course  Procedures (including critical care time)  Results for orders placed during the hospital encounter of 06/25/12  CBC      Component Value Range   WBC 6.6  4.0 - 10.5 K/uL   RBC 3.86 (*) 4.22 - 5.81 MIL/uL   Hemoglobin 13.1  13.0 - 17.0 g/dL   HCT 96.0 (*) 45.4 - 09.8 %   MCV 95.1  78.0 - 100.0 fL   MCH 33.9  26.0 - 34.0 pg   MCHC 35.7  30.0 - 36.0 g/dL   RDW 11.9  14.7 - 82.9 %   Platelets 196  150 - 400 K/uL  BASIC METABOLIC PANEL      Component Value Range   Sodium 133 (*) 135 - 145 mEq/L   Potassium 3.8  3.5 - 5.1 mEq/L   Chloride 96  96 - 112 mEq/L   CO2 28  19 - 32 mEq/L   Glucose, Bld 100 (*) 70 - 99 mg/dL   BUN 21  6 - 23 mg/dL   Creatinine, Ser 5.62  0.50 - 1.35 mg/dL   Calcium 13.0  8.4 - 86.5 mg/dL   GFR calc non Af Amer 66 (*) >90 mL/min   GFR calc Af Amer 76 (*) >90 mL/min    Date: 06/25/2012  Rate: 74  Rhythm: normal sinus rhythm  QRS Axis: normal  Intervals: QT prolonged  ST/T Wave abnormalities: nonspecific ST/T changes  Conduction Disutrbances:right bundle branch block  Narrative Interpretation: new RBBB  Old EKG Reviewed: changes noted  Metoprolol provided and recheck BP improved. ECG as above - new RBBB. No ACS symptoms. Labs reviewed as above. On recheck, PT is feeling better given improved BP. No change on exam otherwise.   Plan PCP follow up for recheck BP and review of medications. PT comfortable with plan and agrees to all d/c and f/u instructions, stable for d/c home   MDM   Nursing notes and VS reviewed. Old records reviewed. ECG and labs reviewed. Improved with medications as above.         Sunnie Nielsen, MD 06/25/12 [phone]

## 2012-07-04 ENCOUNTER — Ambulatory Visit (INDEPENDENT_AMBULATORY_CARE_PROVIDER_SITE_OTHER): Payer: Medicare Other | Admitting: Urology

## 2012-07-04 DIAGNOSIS — N320 Bladder-neck obstruction: Secondary | ICD-10-CM

## 2012-08-15 ENCOUNTER — Encounter: Payer: Self-pay | Admitting: Internal Medicine

## 2012-09-13 ENCOUNTER — Emergency Department (HOSPITAL_COMMUNITY): Payer: Medicare Other

## 2012-09-13 ENCOUNTER — Emergency Department (HOSPITAL_COMMUNITY)
Admission: EM | Admit: 2012-09-13 | Discharge: 2012-09-14 | Disposition: A | Discharge status: Home / Self Care | Payer: Medicare Other | Attending: Emergency Medicine | Admitting: Emergency Medicine

## 2012-09-13 ENCOUNTER — Encounter (HOSPITAL_COMMUNITY): Payer: Self-pay | Admitting: *Deleted

## 2012-09-13 DIAGNOSIS — Z7982 Long term (current) use of aspirin: Secondary | ICD-10-CM | POA: Insufficient documentation

## 2012-09-13 DIAGNOSIS — E7800 Pure hypercholesterolemia, unspecified: Secondary | ICD-10-CM | POA: Insufficient documentation

## 2012-09-13 DIAGNOSIS — K5900 Constipation, unspecified: Secondary | ICD-10-CM | POA: Insufficient documentation

## 2012-09-13 DIAGNOSIS — R339 Retention of urine, unspecified: Secondary | ICD-10-CM | POA: Insufficient documentation

## 2012-09-13 DIAGNOSIS — Z8669 Personal history of other diseases of the nervous system and sense organs: Secondary | ICD-10-CM | POA: Insufficient documentation

## 2012-09-13 DIAGNOSIS — I10 Essential (primary) hypertension: Secondary | ICD-10-CM | POA: Insufficient documentation

## 2012-09-13 DIAGNOSIS — K6289 Other specified diseases of anus and rectum: Secondary | ICD-10-CM | POA: Insufficient documentation

## 2012-09-13 DIAGNOSIS — Z9889 Other specified postprocedural states: Secondary | ICD-10-CM | POA: Insufficient documentation

## 2012-09-13 DIAGNOSIS — Z87891 Personal history of nicotine dependence: Secondary | ICD-10-CM | POA: Insufficient documentation

## 2012-09-13 DIAGNOSIS — Z79899 Other long term (current) drug therapy: Secondary | ICD-10-CM | POA: Insufficient documentation

## 2012-09-13 DIAGNOSIS — E119 Type 2 diabetes mellitus without complications: Secondary | ICD-10-CM | POA: Insufficient documentation

## 2012-09-13 LAB — URINALYSIS, ROUTINE W REFLEX MICROSCOPIC
Bilirubin Urine: NEGATIVE
Glucose, UA: NEGATIVE mg/dL
Protein, ur: NEGATIVE mg/dL

## 2012-09-13 LAB — COMPREHENSIVE METABOLIC PANEL
ALT: 11 U/L (ref 0–53)
AST: 24 U/L (ref 0–37)
Alkaline Phosphatase: 61 U/L (ref 39–117)
CO2: 24 mEq/L (ref 19–32)
Chloride: 95 mEq/L — ABNORMAL LOW (ref 96–112)
GFR calc non Af Amer: 56 mL/min — ABNORMAL LOW (ref >90)
Glucose, Bld: 162 mg/dL — ABNORMAL HIGH (ref 70–99)
Sodium: 132 mEq/L — ABNORMAL LOW (ref 135–145)
Total Bilirubin: 0.6 mg/dL (ref 0.3–1.2)

## 2012-09-13 LAB — CBC WITH DIFFERENTIAL/PLATELET
Basophils Absolute: 0 10*3/uL (ref 0.0–0.1)
HCT: 41.6 % (ref 39.0–52.0)
Lymphocytes Relative: 4 % — ABNORMAL LOW (ref 12–46)
Lymphs Abs: 0.7 10*3/uL (ref 0.7–4.0)
Neutro Abs: 16.6 10*3/uL — ABNORMAL HIGH (ref 1.7–7.7)
Platelets: 194 10*3/uL (ref 150–400)
RBC: 4.26 MIL/uL (ref 4.22–5.81)
RDW: 13.7 % (ref 11.5–15.5)
WBC: 18.7 10*3/uL — ABNORMAL HIGH (ref 4.0–10.5)

## 2012-09-13 LAB — URINE MICROSCOPIC-ADD ON

## 2012-09-13 MED ORDER — SENNOSIDES-DOCUSATE SODIUM 8.6-50 MG PO TABS: 2.0000 | ORAL_TABLET | Freq: Every day | ORAL | Status: DC

## 2012-09-13 MED ORDER — HYDROMORPHONE HCL PF 1 MG/ML IJ SOLN
0.5000 mg | Freq: Once | INTRAMUSCULAR | Status: AC
  Administered 2012-09-13: 0.5 mg via INTRAVENOUS
  Filled 2012-09-13: qty 1

## 2012-09-13 MED ORDER — CIPROFLOXACIN IN D5W 400 MG/200ML IV SOLN
400.0000 mg | Freq: Once | INTRAVENOUS | Status: AC
  Administered 2012-09-13: 400 mg via INTRAVENOUS
  Filled 2012-09-13: qty 200

## 2012-09-13 MED ORDER — ONDANSETRON HCL 4 MG/2ML IJ SOLN
4.0000 mg | Freq: Once | INTRAMUSCULAR | Status: AC
  Administered 2012-09-13: 4 mg via INTRAVENOUS
  Filled 2012-09-13: qty 2

## 2012-09-13 MED ORDER — SODIUM CHLORIDE 0.9 % IV SOLN: 1000.0000 mL | Freq: Once | INTRAVENOUS | Status: DC

## 2012-09-13 MED ORDER — FLEET ENEMA 7-19 GM/118ML RE ENEM
1.0000 | ENEMA | Freq: Once | RECTAL | Status: AC
  Administered 2012-09-14: 1 via RECTAL

## 2012-09-13 MED ORDER — MAGNESIUM CITRATE PO SOLN: 296.0000 mL | Freq: Once | ORAL | Status: DC

## 2012-09-13 MED ORDER — PHOSPHATE ENEMA 7-19 GM/118ML RE ENEM: 1.0000 | ENEMA | Freq: Once | RECTAL | Status: DC | PRN

## 2012-09-13 MED ORDER — IOHEXOL 300 MG/ML  SOLN
100.0000 mL | Freq: Once | INTRAMUSCULAR | Status: AC | PRN
  Administered 2012-09-13: 100 mL via INTRAVENOUS

## 2012-09-13 NOTE — ED Notes (Signed)
Patient in CT at this time.

## 2012-09-13 NOTE — ED Notes (Signed)
Patient finishing second bottle of PO CT contrast at this time.

## 2012-09-13 NOTE — ED Notes (Signed)
Patient given oral CT contrast by radiology. States that he will be ready to go around 2200.

## 2012-09-13 NOTE — ED Notes (Addendum)
"  I'm locked up".  Constipation , small bm yesterday,Has not taken a laxative.    Low abd pain

## 2012-09-13 NOTE — ED Notes (Signed)
Bladder scanner results 785 ml; MD notified.

## 2012-09-13 NOTE — ED Provider Notes (Addendum)
History   This chart was scribed for Gerhard Munch, MD by Sofie Rower. The patient was seen in room APA08/AP[facility]08 and the patient's care was started at 5:39PM.     CSN: [id number]  Arrival date & time 09/13/12  1706   First MD Initiated Contact with Patient 09/13/12 1739      Chief Complaint  Patient presents with  . Abdominal Pain    (Consider location/radiation/quality/duration/timing/severity/associated sxs/prior treatment) The history is provided by the patient and the spouse. No language interpreter was used.    Bradley Reed is a 78 y.o. male , with a hx of colonoscopy (performed 10 years ago), who presents to the Emergency Department complaining of gradual, progressively worsening, abdominal pain, diffusely located over the entire abdomen, onset two days ago (09/11/12).  Associated symptoms include constipation (onset two days ago) and confusion. The pt reports he has not experienced a bowel movement since Saturday, 09/09/12. The pt has taken miralax on Monday, 09/11/12, which has yet to provide relief. In addition, the pt has taken metamucil earlier today, 09/13/12, which does not provide relief of the abdominal pain. The pt has a hx of diabetes and hypertension.  The pt denies any hx of abdominal surgery, fever, vomiting, lack of appetite, disorientation, and any episodes of constipation on the past.   The pt does not smoke, however, he does drink alcohol on occasion.   PCP is Dr. Gerda Diss.    Past Medical History  Diagnosis Date  . Diabetes mellitus   . Hypertension   . Hypercholesteremia   . Glaucoma(365)     Past Surgical History  Procedure Date  . Leg surgery   . Tonsillectomy   . Eye surgery     History reviewed. No pertinent family history.  History  Substance Use Topics  . Smoking status: Former Smoker    Types: Cigarettes  . Smokeless tobacco: Never Used  . Alcohol Use: 0.0 oz/week     Comment: 1 drink a month      Review of Systems    Constitutional:       Per HPI, otherwise negative  HENT:       Per HPI, otherwise negative  Eyes: Negative.   Respiratory:       Per HPI, otherwise negative  Cardiovascular:       Per HPI, otherwise negative  Gastrointestinal: Negative for vomiting.  Genitourinary: Negative.   Musculoskeletal:       Per HPI, otherwise negative  Skin: Negative.   Neurological: Negative for syncope.    Allergies  Bactrim  Home Medications   Current Outpatient Rx  Name  Route  Sig  Dispense  Refill  . VITAMIN C 1000 MG PO TABS   Oral   Take 1,000 mg by mouth 2 (two) times daily.         . ASPIRIN EC 81 MG PO TBEC   Oral   Take 81 mg by mouth daily.         . AVASTIN IV   Intravitreal   by Intravitreal route as directed. Every 7 to 9 weeks (for Macular Degeneration)         . BRINZOLAMIDE-BRIMONIDINE 1-0.2 % OP SUSP   Ophthalmic   Apply 1 drop to eye 2 (two) times daily.         Marland Kitchen CALCIUM CARBONATE-VITAMIN D 600-400 MG-UNIT PO TABS   Oral   Take 1 tablet by mouth 2 (two) times daily.           Marland Kitchen  LISINOPRIL 30 MG PO TABS   Oral   Take 30 mg by mouth every evening.         Marland Kitchen METFORMIN HCL 500 MG PO TABS   Oral   Take 500 mg by mouth 2 (two) times daily.         Marland Kitchen VITEYES AREDS FORMULA PO   Oral   Take 1 tablet by mouth 2 (two) times daily.          Marland Kitchen PRAVASTATIN SODIUM 40 MG PO TABS   Oral   Take 40 mg by mouth every evening.         . PSYLLIUM 95 % PO PACK   Oral   Take 1 packet by mouth daily as needed. For constipation         . TRAVOPROST 0.004 % OP SOLN   Both Eyes   Place 1 drop into both eyes every evening.          Marland Kitchen VITAMIN B-12 100 MCG PO TABS   Oral   Take 100 mcg by mouth 2 (two) times daily.             Pulse 54  Temp 97.4 F (36.3 C) (Oral)  Resp 16  Ht 5\' 9"  (1.753 m)  Wt 139 lb (63.05 kg)  BMI 20.53 kg/m2  SpO2 99%  Physical Exam  Nursing note and vitals reviewed. Constitutional: He is oriented to person, place,  and time. He appears well-developed. No distress.  HENT:  Head: Normocephalic and atraumatic.  Eyes: Conjunctivae normal and EOM are normal.  Cardiovascular: Normal rate and regular rhythm.   Pulmonary/Chest: Effort normal. No stridor. No respiratory distress.  Abdominal: Soft. He exhibits no distension.       Full bladder detected upon palpitation.   Musculoskeletal: He exhibits no edema.  Neurological: He is alert and oriented to person, place, and time.  Skin: Skin is warm and dry.  Psychiatric: He has a normal mood and affect.    ED Course  BLADDER CATHETERIZATION Date/Time: 09/13/2012 10:30 PM Performed by: Gerhard Munch Authorized by: Gerhard Munch Consent: Verbal consent obtained. Risks and benefits: risks, benefits and alternatives were discussed Consent given by: patient Patient identity confirmed: verbally with patient Time out: Immediately prior to procedure a "time out" was called to verify the correct patient, procedure, equipment, support staff and site/side marked as required. Indications: urinary retention Local anesthesia used: yes Anesthesia: see MAR for details Patient sedated: no Preparation: Patient was prepped and draped in the usual sterile fashion. Catheter insertion: temporary indwelling Catheter size: 14 Fr Complicated insertion: no Altered anatomy: no Bladder irrigation: no Number of attempts: 1 Urine volume: 700 ml Urine characteristics: clear Patient tolerance: Patient tolerated the procedure well with no immediate complications.   (including critical care time)  DIAGNOSTIC STUDIES: Oxygen Saturation is 99% on room air, normal by my interpretation.    COORDINATION OF CARE:  6:03 PM- Treatment plan concerning UA and x-ray of abdomen discussed with patient. Pt agrees with treatment.   8:06 PM- Recheck. Treatment plan concerning elevated WBC, ileus, enema, and CT scan of abdomen discussed with patient. Pt agrees with treatment.   11:01  PM- Recheck. Treatment plan discussed with patient. Pt agrees with treatment.      Results for orders placed during the hospital encounter of 09/13/12  CBC WITH DIFFERENTIAL      Component Value Range   WBC 18.7 (*) 4.0 - 10.5 K/uL   RBC 4.26  4.22 - 5.81  MIL/uL   Hemoglobin 14.4  13.0 - 17.0 g/dL   HCT 62.1  30.8 - 65.7 %   MCV 97.7  78.0 - 100.0 fL   MCH 33.8  26.0 - 34.0 pg   MCHC 34.6  30.0 - 36.0 g/dL   RDW 84.6  96.2 - 95.2 %   Platelets 194  150 - 400 K/uL   Neutrophils Relative 89 (*) 43 - 77 %   Neutro Abs 16.6 (*) 1.7 - 7.7 K/uL   Lymphocytes Relative 4 (*) 12 - 46 %   Lymphs Abs 0.7  0.7 - 4.0 K/uL   Monocytes Relative 7  3 - 12 %   Monocytes Absolute 1.3 (*) 0.1 - 1.0 K/uL   Eosinophils Relative 0  0 - 5 %   Eosinophils Absolute 0.0  0.0 - 0.7 K/uL   Basophils Relative 0  0 - 1 %   Basophils Absolute 0.0  0.0 - 0.1 K/uL  COMPREHENSIVE METABOLIC PANEL      Component Value Range   Sodium 132 (*) 135 - 145 mEq/L   Potassium 4.3  3.5 - 5.1 mEq/L   Chloride 95 (*) 96 - 112 mEq/L   CO2 24  19 - 32 mEq/L   Glucose, Bld 162 (*) 70 - 99 mg/dL   BUN 25 (*) 6 - 23 mg/dL   Creatinine, Ser 8.41  0.50 - 1.35 mg/dL   Calcium 32.4  8.4 - 40.1 mg/dL   Total Protein 8.1  6.0 - 8.3 g/dL   Albumin 4.4  3.5 - 5.2 g/dL   AST 24  0 - 37 U/L   ALT 11  0 - 53 U/L   Alkaline Phosphatase 61  39 - 117 U/L   Total Bilirubin 0.6  0.3 - 1.2 mg/dL   GFR calc non Af Amer 56 (*) >90 mL/min   GFR calc Af Amer 65 (*) >90 mL/min  LIPASE, BLOOD      Component Value Range   Lipase 36  11 - 59 U/L   Ct Abdomen Pelvis W Contrast  09/13/2012  *RADIOLOGY REPORT*  Clinical Data: Abdominal pain.  Leukocytosis.  Constipation.  CT ABDOMEN AND PELVIS WITH CONTRAST  Technique:  Multidetector CT imaging of the abdomen and pelvis was performed following the standard protocol during bolus administration of intravenous contrast.  Contrast: OMNIPAQUE IOHEXOL 300 MG/ML. Oral contrast was also  administered.  Comparison: CT abdomen and pelvis 02/25/2012 and CT abdomen 02/02/2008.  Findings: Very large stool burden throughout the colon from cecum to rectum, with the distended rectum demonstrating mild circumferential wall thickening.  No wall thickening elsewhere involving the colon.  No visible sigmoid diverticula.  Multiple small bowel loops in the right upper quadrant positioned lateral to the ascending colon.  No small bowel distention as questioned on the acute abdomen series earlier same date.  Stomach mildly distended but otherwise normal in appearance.  No ascites.  Elevation of the right hemidiaphragm, accounting for the unusual appearance of the liver.  Calcified granuloma in the medial segment left lobe of the liver; no significant abnormality involving the liver.  Normal appearing spleen, pancreas, adrenal glands, and left kidney.  Approximate 2.5 cm simple cyst arising from the lower pole of the otherwise normal left kidney.  Gallbladder contracted but otherwise unremarkable by CT.  No biliary ductal dilation. 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  No significant lymphadenopathy.  Distended urinary bladder.  Prostate gland mildly enlarged, particularly the median lobe, consistent with age.  Chronic scar/atelectasis in the right lower lobe, related to the elevated right hemidiaphragm.  Visualized left lung base clear. Bone window images demonstrate degenerative changes involving the lower thoracic and lumbar spine.  IMPRESSION:  1.  Rectal fecal impaction with associated proctitis.  No evidence of colonic inflammation elsewhere. 2.  Probable internal hernia in the right upper quadrant, as there are small bowel loops positioned lateral to the ascending colon. There is no evidence of bowel obstruction. 3.  Distended urinary bladder.  Mild prostate gland enlargement consistent with age.   Original Report Authenticated By: Hulan Saas, M.D.    Dg Abd 2  Views  09/13/2012  *RADIOLOGY REPORT*  Clinical Data: Abdominal pain.  Generalized weakness. Constipation.  ABDOMEN - 2 VIEW  Comparison: CT abdomen and pelvis 02/25/2012.  CT abdomen 03/06/2008.  Findings: Very large stool burden throughout the colon from cecum to rectum.  Mild gaseous distention of isolated loops of small bowel in the right upper quadrant.  Scattered air-fluid levels within these mildly distended small bowel loops.  No free intraperitoneal air on the erect image.  Atherosclerotic arterial calcifications.  No visible opaque urinary tract calculi. Degenerative changes involving the lumbar spine.  Mildly distended urinary bladder.  IMPRESSION:  1.  Very large stool burden. 2.  Focal ileus involving small bowel loops in the right upper quadrant is favored over an early partial small bowel obstruction. 3.  Mildly distended urinary bladder.   Original Report Authenticated By: Hulan Saas, M.D.        Bedside bladder scan demonstrates ~768ml of urine.  No diagnosis found.   11:07 PM Patient and family informed of results. MDM  I personally performed the services described in this documentation, which was scribed in my presence. The recorded information has been reviewed and considered.  This elderly male presents with abdominal pain.  On exam he is in no distress, with unremarkable vital signs, including no fever.  Given the patient's description of decreased stool and urine production there suspicion of obstructive process.  Following a x-ray, the patient's CT scan for further evaluation.  He also had a bedside bladder scan performed, that demonstrated evidence of retention.  With his retention, a Foley catheter was placed.  Given the proctitis any significant stool burden started the patient on stool softeners, and he was discharged to do an enema at home, with magnesium citrate treatment tomorrow.  Absent fever, distress, though the patient is elderly with comorbidities, he has a  primary care followup, and was appropriate for outpatient followup.  Gerhard Munch, MD 09/13/12 1610  Gerhard Munch, MD 09/13/12 [phone]

## 2012-09-14 LAB — URINE CULTURE
Colony Count: NO GROWTH
Culture: NO GROWTH

## 2012-09-14 NOTE — ED Notes (Signed)
Leg bag placed. Patient given instructions on how to use and empty bag. Patient able to tell me how to use and empty bag.

## 2012-09-19 ENCOUNTER — Ambulatory Visit (INDEPENDENT_AMBULATORY_CARE_PROVIDER_SITE_OTHER): Payer: Medicare Other | Admitting: Urology

## 2012-09-19 DIAGNOSIS — N320 Bladder-neck obstruction: Secondary | ICD-10-CM

## 2013-01-18 ENCOUNTER — Ambulatory Visit (INDEPENDENT_AMBULATORY_CARE_PROVIDER_SITE_OTHER): Payer: Medicare Other | Admitting: Otolaryngology

## 2013-01-18 DIAGNOSIS — H6120 Impacted cerumen, unspecified ear: Secondary | ICD-10-CM

## 2013-01-18 DIAGNOSIS — H903 Sensorineural hearing loss, bilateral: Secondary | ICD-10-CM

## 2013-01-25 ENCOUNTER — Ambulatory Visit (INDEPENDENT_AMBULATORY_CARE_PROVIDER_SITE_OTHER): Payer: Medicare Other | Admitting: Otolaryngology

## 2013-03-11 ENCOUNTER — Emergency Department (HOSPITAL_COMMUNITY): Payer: Medicare Other

## 2013-03-11 ENCOUNTER — Emergency Department (HOSPITAL_COMMUNITY)
Admission: EM | Admit: 2013-03-11 | Discharge: 2013-03-11 | Disposition: A | Discharge status: Home / Self Care | Payer: Medicare Other | Attending: Emergency Medicine | Admitting: Emergency Medicine

## 2013-03-11 ENCOUNTER — Encounter (HOSPITAL_COMMUNITY): Payer: Self-pay

## 2013-03-11 DIAGNOSIS — R5381 Other malaise: Secondary | ICD-10-CM | POA: Insufficient documentation

## 2013-03-11 DIAGNOSIS — I10 Essential (primary) hypertension: Secondary | ICD-10-CM | POA: Insufficient documentation

## 2013-03-11 DIAGNOSIS — Z8669 Personal history of other diseases of the nervous system and sense organs: Secondary | ICD-10-CM | POA: Insufficient documentation

## 2013-03-11 DIAGNOSIS — E7800 Pure hypercholesterolemia, unspecified: Secondary | ICD-10-CM | POA: Insufficient documentation

## 2013-03-11 DIAGNOSIS — E119 Type 2 diabetes mellitus without complications: Secondary | ICD-10-CM | POA: Insufficient documentation

## 2013-03-11 DIAGNOSIS — Z79899 Other long term (current) drug therapy: Secondary | ICD-10-CM | POA: Insufficient documentation

## 2013-03-11 DIAGNOSIS — R42 Dizziness and giddiness: Secondary | ICD-10-CM | POA: Insufficient documentation

## 2013-03-11 DIAGNOSIS — Z87891 Personal history of nicotine dependence: Secondary | ICD-10-CM | POA: Insufficient documentation

## 2013-03-11 DIAGNOSIS — R531 Weakness: Secondary | ICD-10-CM

## 2013-03-11 LAB — CBC WITH DIFFERENTIAL/PLATELET
Basophils Relative: 1 % (ref 0–1)
Eosinophils Absolute: 0.3 10*3/uL (ref 0.0–0.7)
Eosinophils Relative: 4 % (ref 0–5)
MCH: 33.7 pg (ref 26.0–34.0)
MCHC: 34.9 g/dL (ref 30.0–36.0)
Neutrophils Relative %: 71 % (ref 43–77)
Platelets: 210 10*3/uL (ref 150–400)
RDW: 13 % (ref 11.5–15.5)

## 2013-03-11 LAB — HEPATIC FUNCTION PANEL
ALT: 10 U/L (ref 0–53)
AST: 19 U/L (ref 0–37)
Albumin: 3.9 g/dL (ref 3.5–5.2)
Alkaline Phosphatase: 70 U/L (ref 39–117)
Bilirubin, Direct: 0.1 mg/dL (ref 0.0–0.3)
Indirect Bilirubin: 0.4 mg/dL (ref 0.3–0.9)
Total Bilirubin: 0.5 mg/dL (ref 0.3–1.2)
Total Protein: 7.5 g/dL (ref 6.0–8.3)

## 2013-03-11 LAB — URINALYSIS, ROUTINE W REFLEX MICROSCOPIC
Hgb urine dipstick: NEGATIVE
Ketones, ur: NEGATIVE mg/dL
Protein, ur: NEGATIVE mg/dL
Urobilinogen, UA: 0.2 mg/dL (ref 0.0–1.0)

## 2013-03-11 LAB — BASIC METABOLIC PANEL
Calcium: 9.5 mg/dL (ref 8.4–10.5)
GFR calc Af Amer: 63 mL/min — ABNORMAL LOW (ref >90)
GFR calc non Af Amer: 54 mL/min — ABNORMAL LOW (ref >90)
Potassium: 4 mEq/L (ref 3.5–5.1)
Sodium: 136 mEq/L (ref 135–145)

## 2013-03-11 LAB — LIPASE, BLOOD: Lipase: 59 U/L (ref 11–59)

## 2013-03-11 LAB — LACTIC ACID, PLASMA: Lactic Acid, Venous: 1 mmol/L (ref 0.5–2.2)

## 2013-03-11 LAB — GLUCOSE, CAPILLARY: Glucose-Capillary: 122 mg/dL — ABNORMAL HIGH (ref 70–99)

## 2013-03-11 MED ORDER — SODIUM CHLORIDE 0.9 % IV SOLN
Freq: Once | INTRAVENOUS | Status: AC
  Administered 2013-03-11: 14:00:00 via INTRAVENOUS

## 2013-03-11 NOTE — ED Provider Notes (Signed)
History  This chart was scribed for Bradley Octave, MD by Shari Heritage, ED Scribe. The patient was seen in room APA06/APA06. Patient's care was started at 1257.  CSN: [id number]  Arrival date & time 03/11/13  1240   First MD Initiated Contact with Patient 03/11/13 1257      Chief Complaint  Patient presents with  . Fatigue     The history is provided by the patient. No language interpreter was used.    HPI Comments: Bradley Reed is a 79 y.o. male with history of diabetes, hypertension, hypercholesteremia and glaucoma who presents to the Emergency Department complaining of persistent generalized weakness and fatigue onset 11 am today. Wife says that patient woke up feeling normal and after taking a shower he started feeling weak. Patient states that he is "not feeling right," but is unable to characterize his symptoms well. He denies history of the same. He reports associated dizziness and lightheadedness, but denies any dizziness at this time. Patient denies chest pain, back pain, abdominal pain, fever, vomiting, constipation, blood in stool, dysuria, hematuria or diarrhea. He denies any recent falls or trauma. Wife has not noticed any behavioral changes. She states that patient has been eating and drinking well, but today he only ate half of a grapefruit for breakfast. She has not witnessed any falls or injuries. Patient is a former smoker.  Past Medical History  Diagnosis Date  . Diabetes mellitus   . Hypertension   . Hypercholesteremia   . Glaucoma(365)     Past Surgical History  Procedure Laterality Date  . Leg surgery    . Tonsillectomy    . Eye surgery      No family history on file.  History  Substance Use Topics  . Smoking status: Former Smoker    Types: Cigarettes  . Smokeless tobacco: Never Used  . Alcohol Use: 0.0 oz/week     Comment: occ      Review of Systems A complete 10 system review of systems was obtained and all systems are negative except as  noted in the HPI and PMH.   Allergies  Bactrim  Home Medications   Current Outpatient Rx  Name  Route  Sig  Dispense  Refill  . Ascorbic Acid (VITAMIN C) 1000 MG tablet   Oral   Take 1,000 mg by mouth 2 (two) times daily.         Marland Kitchen aspirin EC 81 MG tablet   Oral   Take 81 mg by mouth daily.         . Bevacizumab (AVASTIN IV)   Intravitreal   by Intravitreal route as directed. Every 7 to 9 weeks (for Macular Degeneration)         . Brinzolamide-Brimonidine (SIMBRINZA) 1-0.2 % SUSP   Ophthalmic   Apply 1 drop to eye 2 (two) times daily.         . Calcium Carbonate-Vitamin D (CALCIUM 600+D) 600-400 MG-UNIT per tablet   Oral   Take 1 tablet by mouth 2 (two) times daily.           Marland Kitchen lisinopril (PRINIVIL,ZESTRIL) 30 MG tablet   Oral   Take 30 mg by mouth every evening.         . metFORMIN (GLUCOPHAGE) 500 MG tablet   Oral   Take 500 mg by mouth 2 (two) times daily.         . Multiple Vitamins-Minerals (VITEYES AREDS FORMULA PO)   Oral   Take 1 tablet  by mouth 2 (two) times daily.          . pravastatin (PRAVACHOL) 40 MG tablet   Oral   Take 40 mg by mouth every evening.         . travoprost, benzalkonium, (TRAVATAN) 0.004 % ophthalmic solution   Both Eyes   Place 1 drop into both eyes every evening.          . vitamin B-12 (CYANOCOBALAMIN) 100 MCG tablet   Oral   Take 100 mcg by mouth 2 (two) times daily.           . psyllium (HYDROCIL/METAMUCIL) 95 % PACK   Oral   Take 1 packet by mouth daily as needed. For constipation         . senna-docusate (SENOKOT-S) 8.6-50 MG per tablet   Oral   Take 2 tablets by mouth daily.   20 tablet   0     Triage Vitals: BP 145/81  Pulse 71  Temp(Src) 97.8 F (36.6 C) (Oral)  Resp 18  Ht 5\' 9"  (1.753 m)  Wt 149 lb (67.586 kg)  BMI 21.99 kg/m2  SpO2 100%  Physical Exam  Constitutional: He is oriented to person, place, and time. He appears well-developed and well-nourished. No distress.  HENT:   Head: Normocephalic and atraumatic.  Mouth/Throat: Oropharynx is clear and moist.  Eyes: Conjunctivae and EOM are normal. Pupils are equal, round, and reactive to light.  Neck: Normal range of motion. Neck supple.  Cardiovascular: Normal rate, regular rhythm and normal heart sounds.   No murmur heard. Pulmonary/Chest: Effort normal and breath sounds normal.  Abdominal: Soft. There is no tenderness.  Musculoskeletal: Normal range of motion. He exhibits no edema and no tenderness.  Neurological: He is alert and oriented to person, place, and time.  No ataxia on finger to nose bilaterally. No focal deficits. 5/5 strength throughout. No pronator drift. Negative romberg.   Skin: Skin is warm and dry.    ED Course  Procedures (including critical care time) DIAGNOSTIC STUDIES: Oxygen Saturation is 100% on room air, normal by my interpretation.    COORDINATION OF CARE: 1:08 PM- Wife is bedside. Patient informed of current plan for treatment and evaluation and agrees with plan at this time.  2:37 PM- Patient states that dizziness has improved. He says that he was able to ambulate in the ED without difficulty. Patient is sitting comfortably and eating lunch. He denies any chest pain. Lungs are clear to auscultation. Heart has regular rate and rhythm.      Labs Reviewed  CBC WITH DIFFERENTIAL - Abnormal; Notable for the following:    RBC 3.95 (*)    HCT 38.1 (*)    All other components within normal limits  BASIC METABOLIC PANEL - Abnormal; Notable for the following:    Glucose, Bld 163 (*)    BUN 25 (*)    GFR calc non Af Amer 54 (*)    GFR calc Af Amer 63 (*)    All other components within normal limits  GLUCOSE, CAPILLARY - Abnormal; Notable for the following:    Glucose-Capillary 122 (*)    All other components within normal limits  TROPONIN I  URINALYSIS, ROUTINE W REFLEX MICROSCOPIC  LACTIC ACID, PLASMA  HEPATIC FUNCTION PANEL  LIPASE, BLOOD   Dg Chest 2 View  03/11/2013   *RADIOLOGY REPORT*  Clinical Data: Weakness  CHEST - 2 VIEW  Comparison: 01/20/2012  Findings: Chronic right hemidiaphragm elevation with basilar atelectasis.  Normal  heart size and vascularity.  No new airspace disease, collapse, consolidation, pneumonia, edema, effusion, or pneumothorax.  Trachea midline.  Atherosclerosis of the aorta. Diffuse degenerative changes of the spine.  IMPRESSION: Stable chronic right hemidiaphragm elevation with basilar atelectasis / scarring.  No new process.   Original Report Authenticated By: Judie Petit. Miles Costain, M.D.    Ct Head Wo Contrast  03/11/2013  *RADIOLOGY REPORT*  Clinical Data: Weakness and fatigue.  CT HEAD WITHOUT CONTRAST  Technique:  Contiguous axial images were obtained from the base of the skull through the vertex without contrast.  Comparison: 03/19/2011.  Findings: No intracranial hemorrhage.  Small vessel disease type changes without CT evidence of large acute infarct.  Global atrophy without hydrocephalus.  No intracranial mass lesion detected on this unenhanced exam.  Mild mucosal thickening ethmoid sinus air cells.  IMPRESSION: No acute abnormality.  Please see above.   Original Report Authenticated By: Lacy Duverney, M.D.      No diagnosis found.    MDM  2 hour history of poorly described generalized weakness and fatigue. Wife states he felt well when he got up. No complains of generalized weakness, fatigue, intermittent dizziness. No dizziness currently. Poor by mouth intake today  Nonfocal neurological exam. No ataxia, nystagmus, pronator drift. Negative Romberg.  Urinalysis negative. Chest x-ray negative. New Right Bundle branch block on EKG. No chest pain, palpitations or shortness of breath.  Laboratory appears to be at baseline. Normal kidney function. Normal and electrolytes. Patient tolerating by mouth and ambulatory without assistance. Denies dizziness or lightheadedness states he feels better and at his baseline.   Date: 03/11/2013  Rate:  69  Rhythm: normal sinus rhythm  QRS Axis: normal  Intervals: PR prolonged  ST/T Wave abnormalities: nonspecific ST/T changes  Conduction Disutrbances:right bundle branch block  Narrative Interpretation: new RBBB  Old EKG Reviewed: changes noted      I personally performed the services described in this documentation, which was scribed in my presence. The recorded information has been reviewed and is accurate.      Bradley Octave, MD 03/11/13 2330

## 2013-03-11 NOTE — ED Notes (Addendum)
Patient with no complaints at this time. Respirations even and unlabored. Skin warm/dry. Discharge instructions reviewed with patient at this time. Patient given opportunity to voice concerns/ask questions. IV removed per policy and band-aid applied to site. Patient discharged at this time and left Emergency Department via wheelchair.  

## 2013-03-11 NOTE — ED Notes (Signed)
Patient ambulated around nurses station, steady gait, no complaints at this time.

## 2013-03-11 NOTE — ED Notes (Signed)
Water provided for PO challenge

## 2013-03-11 NOTE — ED Notes (Signed)
Patient standing at bedside to collect urine sample. Family with patient to assist.

## 2013-03-11 NOTE — ED Notes (Signed)
Pt reports woke up feeling "mentally  Not right."  C/o generalized weakness that started at 11am.  Denies any n/v/d or chest pain.  Denies any SOB.

## 2013-03-11 NOTE — ED Notes (Signed)
Patient complained of being "wobblie" with position change. Patient became steady after being still for a "few minutes" no other complaints voiced at this time.

## 2013-03-13 ENCOUNTER — Encounter: Payer: Self-pay | Admitting: *Deleted

## 2013-03-16 ENCOUNTER — Ambulatory Visit (INDEPENDENT_AMBULATORY_CARE_PROVIDER_SITE_OTHER): Payer: Medicare Other | Admitting: Family Medicine

## 2013-03-16 ENCOUNTER — Encounter: Payer: Self-pay | Admitting: Family Medicine

## 2013-03-16 VITALS — BP 110/68 | HR 80 | Wt 150.0 lb

## 2013-03-16 DIAGNOSIS — J309 Allergic rhinitis, unspecified: Secondary | ICD-10-CM | POA: Insufficient documentation

## 2013-03-16 DIAGNOSIS — R5381 Other malaise: Secondary | ICD-10-CM

## 2013-03-16 DIAGNOSIS — R5383 Other fatigue: Secondary | ICD-10-CM

## 2013-03-16 DIAGNOSIS — R0602 Shortness of breath: Secondary | ICD-10-CM

## 2013-03-16 MED ORDER — FLUTICASONE PROPIONATE 50 MCG/ACT NA SUSP: 2.0000 | Freq: Every day | NASAL | Status: DC

## 2013-03-16 NOTE — Progress Notes (Signed)
  Subjective:    Patient ID: Bradley Reed, male    DOB: 1924/08/15, 79 y.o.   MRN: [id number]  HPI  Profound fatigue. Went to the er. No energy. Couldn't breathe. Felt like never before. Frightened by it. Went via ems.  Patient stated his allergies were acting up. He was out in a hot environment. Trying to work outdoors. Suddenly became extremely tired. He was worried that something very serious was going on. They called the ambulance.  Patient had a very thorough workup through the emergency room. The entire ER notes and all studies and tests are reviewed today.  Patient reports that he is feeling more energetic now. No obvious difficulties. Compliant with his medications.  Review of Systems No chest pain no dyspnea no fever no chills good appetite no weight loss. ROS otherwise negative.    Objective:   Physical Exam Alert no acute distress. HEENT mild nasal congestion. Vitals reviewed. Lungs clear. Heart rare rhythm. Abdomen benign. Ankles without edema.       Assessment & Plan:  Impression fatigue discussed at great length. Likely a combination of difficulties including working in the heat, difficulty with allergies, multiple medicines that blunt his ability to work in the heat. All discussed. Entire hospital no reviewed. Easily 35 minutes spent most in discussion plan add Flonase. Avoidance measures discussed. Gen. reassurance. Followup regular appointment.

## 2013-03-22 ENCOUNTER — Other Ambulatory Visit: Payer: Self-pay | Admitting: Family Medicine

## 2013-04-03 ENCOUNTER — Encounter: Payer: Self-pay | Admitting: *Deleted

## 2013-04-05 ENCOUNTER — Encounter: Payer: Self-pay | Admitting: Family Medicine

## 2013-04-05 ENCOUNTER — Ambulatory Visit (INDEPENDENT_AMBULATORY_CARE_PROVIDER_SITE_OTHER): Payer: Medicare Other | Admitting: Family Medicine

## 2013-04-05 VITALS — BP 126/78 | Wt 149.2 lb

## 2013-04-05 DIAGNOSIS — E119 Type 2 diabetes mellitus without complications: Secondary | ICD-10-CM

## 2013-04-05 DIAGNOSIS — I10 Essential (primary) hypertension: Secondary | ICD-10-CM

## 2013-04-05 DIAGNOSIS — E7800 Pure hypercholesterolemia, unspecified: Secondary | ICD-10-CM

## 2013-04-05 DIAGNOSIS — F0390 Unspecified dementia without behavioral disturbance: Secondary | ICD-10-CM

## 2013-04-05 NOTE — Progress Notes (Signed)
  Subjective:    Patient ID: Bradley Reed, male    DOB: Dec 27, 1923, 79 y.o.   MRN: [id number]  HPI  Blood sugars decent 116. Mostly low 100s. Claims compliance with his diet. Also compliant with his medications.  Notes overall fatigue has improved.  No further near syncopal events. Trying to stay out of the heat. Compliant with all other medications.  Watching his diet. Perhaps not walking as much as he should with recent visit to the hospital.  Review of Systems    12 point review systems otherwise negative Objective:   Physical Exam Alert no acute distress. HEENT normal. Lungs clear. Heart regular rate and rhythm. Ankles without edema. Pulses good. Sensation intact.       Assessment & Plan:  Impression #1 type 2 diabetes apparent good control. #2 hypertension also good control discussed. #3 fatigue ongoing but somewhat improved. #4 dementia clinically stable per patient and spouse early in the process fortunately Plan 25 minutes spent with patient and family most in discussion. Symptomatic care discussed. Diet exercise discussed. Maintain same meds. Recheck in several months.

## 2013-04-05 NOTE — Patient Instructions (Signed)
10 days ahead of visit call us for blood work

## 2013-04-08 DIAGNOSIS — F0390 Unspecified dementia without behavioral disturbance: Secondary | ICD-10-CM | POA: Insufficient documentation

## 2013-04-23 ENCOUNTER — Other Ambulatory Visit: Payer: Self-pay | Admitting: Family Medicine

## 2013-05-30 ENCOUNTER — Encounter: Payer: Self-pay | Admitting: Neurology

## 2013-05-30 ENCOUNTER — Ambulatory Visit (INDEPENDENT_AMBULATORY_CARE_PROVIDER_SITE_OTHER): Payer: Medicare Other | Admitting: Neurology

## 2013-05-30 VITALS — BP 160/84 | HR 78 | Ht 67.5 in | Wt 150.0 lb

## 2013-05-30 DIAGNOSIS — F0390 Unspecified dementia without behavioral disturbance: Secondary | ICD-10-CM

## 2013-05-30 MED ORDER — DONEPEZIL HCL 5 MG PO TABS: 5.0000 mg | ORAL_TABLET | Freq: Every day | ORAL | Status: DC

## 2013-05-30 NOTE — Progress Notes (Signed)
Reason for visit: Memory disturbance  Bradley Reed is an 79 y.o. male  History of present illness:  Bradley Reed is an 79 year old left-handed white male with a history of a progressive memory disturbance. In the past, he has not wished to go on medications for memory. The patient continues to live with his wife, and he still operates a motor vehicle in a limited fashion. The patient reports no safety issues in this regard. The patient has macular degeneration, and he is being treated with Avastin and vitamins. The patient has gotten new hearing aids, which has helped his ability to understand what is being said to him. The patient denies any other significant medical issues that have come up since last seen. The patient indicates that he is sleeping well.  Past Medical History  Diagnosis Date  . Diabetes mellitus   . Hypertension   . Hypercholesteremia   . Glaucoma   . Diverticulosis   . Prostatism   . Reflux   . Tremor   . Other general symptoms(780.99)   . Other vitamin B12 deficiency anemia   . Essential and other specified forms of tremor   . Abnormality of gait   . Memory loss   . Hearing difficulty of both ears     Bilateral hearing aids  . Macular degeneration     Past Surgical History  Procedure Laterality Date  . Leg surgery    . Tonsillectomy    . Eye surgery      Family History  Problem Relation Age of Onset  . Stroke Mother   . Heart failure Father   . Heart disease Brother   . Arthritis Brother     Social history:  reports that he quit smoking about 50 years ago. His smoking use included Cigarettes. He smoked 0.00 packs per day. He has never used smokeless tobacco. He reports that he drinks about 0.6 ounces of alcohol per week. He reports that he does not use illicit drugs.  Allergies:  Allergies  Allergen Reactions  . Ciprofloxacin   . Bactrim Other (See Comments)    Shaking, patient doesn't recall this allergy     Medications:  Current  Outpatient Prescriptions on File Prior to Visit  Medication Sig Dispense Refill  . aspirin EC 81 MG tablet Take 81 mg by mouth daily.      . Bevacizumab (AVASTIN IV) by Intravitreal route as directed. Every 7 to 9 weeks (for Macular Degeneration)      . Brinzolamide-Brimonidine (SIMBRINZA) 1-0.2 % SUSP Apply 1 drop to eye 2 (two) times daily.      . Calcium Carbonate-Vitamin D (CALCIUM 600+D) 600-400 MG-UNIT per tablet Take 1 tablet by mouth 2 (two) times daily.        . Glucosamine 500 MG TABS Take 500 mg by mouth 3 (three) times daily.      Marland Kitchen lisinopril (PRINIVIL,ZESTRIL) 40 MG tablet TAKE ONE TABLET BY MOUTH ONCE DAILY.  30 tablet  5  . metFORMIN (GLUCOPHAGE) 1000 MG tablet TAKE 1 TABLET BY MOUTH TWICE DAILY WITH MEALS.  60 tablet  0  . Multiple Vitamins-Minerals (VITEYES AREDS FORMULA PO) Take 1 tablet by mouth 2 (two) times daily.       . vitamin B-12 (CYANOCOBALAMIN) 100 MCG tablet Take 100 mcg by mouth 2 (two) times daily.         No current facility-administered medications on file prior to visit.    ROS:  Out of a complete 14 system review  of symptoms, the patient complains only of the following symptoms, and all other reviewed systems are negative.  Fatigue Shortness of breath Memory loss, confusion, decreased energy  Blood pressure 160/84, pulse 78, height 5' 7.5" (1.715 m), weight 150 lb (68.04 kg).  Physical Exam  General: The patient is alert and cooperative at the time of the examination.  Skin: No significant peripheral edema is noted.   Neurologic Exam  Mental status: Mini-Mental status examination done today shows a total score of 25/30. The patient is able to name 8 animals in 60 seconds.  Cranial nerves: Facial symmetry is present. Speech is normal, no aphasia or dysarthria is noted. Extraocular movements are full. Visual fields are full.  Motor: The patient has good strength in all 4 extremities.  Coordination: The patient has good finger-nose-finger and  heel-to-shin bilaterally.  Gait and station: The patient has a normal gait. Tandem gait is slightly unsteady. Romberg is negative. No drift is seen.  Reflexes: Deep tendon reflexes are symmetric.   Assessment/Plan:  One. Progressive memory disturbance  At this point, the patient is willing to initiate medications for memory. I will start Aricept at this time. The patient will followup through this office in 6-8 months. The patient will contact me if he is having side effects on the medication. The patient will be started on a 5 mg tablet, and he will call me in one month to go up to the 10 mg tablet if he is doing well.  Marlan Palau MD 05/30/2013 7:18 PM  [facility] [address] [place], [place] [address]  Phone [phone] Fax [phone]

## 2013-05-30 NOTE — Patient Instructions (Signed)
Take the donepizil (Aricept) at the 5 mg dose for one month, and if this is tolerated, call our office for a 10 mg prescription, which is the usual daily dose.

## 2013-06-25 ENCOUNTER — Other Ambulatory Visit: Payer: Self-pay | Admitting: Family Medicine

## 2013-07-04 ENCOUNTER — Encounter: Payer: Self-pay | Admitting: Family Medicine

## 2013-07-04 ENCOUNTER — Ambulatory Visit (INDEPENDENT_AMBULATORY_CARE_PROVIDER_SITE_OTHER): Payer: Medicare Other | Admitting: Family Medicine

## 2013-07-04 VITALS — BP 110/70 | Ht 69.0 in | Wt 151.6 lb

## 2013-07-04 DIAGNOSIS — E7800 Pure hypercholesterolemia, unspecified: Secondary | ICD-10-CM

## 2013-07-04 DIAGNOSIS — Z79899 Other long term (current) drug therapy: Secondary | ICD-10-CM

## 2013-07-04 DIAGNOSIS — E119 Type 2 diabetes mellitus without complications: Secondary | ICD-10-CM

## 2013-07-04 DIAGNOSIS — I10 Essential (primary) hypertension: Secondary | ICD-10-CM

## 2013-07-04 DIAGNOSIS — F0390 Unspecified dementia without behavioral disturbance: Secondary | ICD-10-CM

## 2013-07-04 DIAGNOSIS — E782 Mixed hyperlipidemia: Secondary | ICD-10-CM

## 2013-07-04 LAB — POCT GLYCOSYLATED HEMOGLOBIN (HGB A1C): Hemoglobin A1C: 6.3

## 2013-07-04 NOTE — Progress Notes (Signed)
  Subjective:    Patient ID: Bradley Reed, male    DOB: October 28, 1924, 79 y.o.   MRN: [id number]  Diabetes He presents for his follow-up diabetic visit. He has type 2 diabetes mellitus. His disease course has been stable. Pertinent negatives for diabetes include no blurred vision. Symptoms are stable. Current diabetic treatment includes diet. He is compliant with treatment all of the time. His weight is stable. He is following a generally healthy diet. Meal planning includes avoidance of concentrated sweets. There is no change in his home blood glucose trend. His breakfast blood glucose is taken between 7-8 am. His breakfast blood glucose range is generally 90-110 mg/dl.   Results for orders placed in visit on 07/04/13  POCT GLYCOSYLATED HEMOGLOBIN (HGB A1C)      Result Value Range   Hemoglobin A1C 6.3       Patient arrives for a check up on his diabetes and hypertension. Patient states he is not having any problems currently.  Still having challenges with mild dementia. Recently saw a specialist. They added no new medications.  Claims compliance with medicine. Blood pressures good when he checks it elsewhere.  Compliant with medication for hyperlipidemia. No obvious side effects with it.  Review of Systems  Eyes: Negative for blurred vision.   no chest pain no back pain no abdominal pain no change in bowel habits. ROS otherwise negative     Objective:   Physical Exam  Alert no acute distress. Lungs clear. Heart regular rate and rhythm. HEENT normal. Neuro exam stable feet pulses good sensation intact  impression 1 type 2 diabetes good control. #2 hypertension good control. #3 early dementia and discussed. #4 hyperlipidemia stable. Plan maintain same medications. Diet exercise discussed. Concerns discussed. 25 minutes spent most in discussion. WSL      Assessment & Plan:  See above and physical exam:

## 2013-07-05 LAB — LIPID PANEL
Cholesterol: 153 mg/dL (ref 0–200)
LDL Cholesterol: 85 mg/dL (ref 0–99)
Triglycerides: 79 mg/dL (ref <150)

## 2013-07-05 LAB — HEPATIC FUNCTION PANEL
Alkaline Phosphatase: 58 U/L (ref 39–117)
Indirect Bilirubin: 0.6 mg/dL (ref 0.0–0.9)
Total Protein: 7.1 g/dL (ref 6.0–8.3)

## 2013-07-10 ENCOUNTER — Encounter: Payer: Self-pay | Admitting: Family Medicine

## 2013-08-23 ENCOUNTER — Other Ambulatory Visit: Payer: Self-pay | Admitting: Family Medicine

## 2013-09-24 ENCOUNTER — Other Ambulatory Visit: Payer: Self-pay | Admitting: Family Medicine

## 2013-10-08 ENCOUNTER — Ambulatory Visit (INDEPENDENT_AMBULATORY_CARE_PROVIDER_SITE_OTHER): Payer: Medicare Other | Admitting: Family Medicine

## 2013-10-08 ENCOUNTER — Encounter: Payer: Self-pay | Admitting: Family Medicine

## 2013-10-08 VITALS — BP 138/80 | Ht 69.0 in | Wt 153.0 lb

## 2013-10-08 DIAGNOSIS — E119 Type 2 diabetes mellitus without complications: Secondary | ICD-10-CM

## 2013-10-08 NOTE — Progress Notes (Signed)
   Subjective:    Patient ID: Bradley Reed, male    DOB: 1924-04-30, 79 y.o.   MRN: [id number]  HPI AWV- Annual Wellness Visit  The patient was seen for their annual wellness visit. The patient's past medical history, surgical history, and family history were reviewed. Pertinent vaccines were reviewed ( tetanus, pneumonia, shingles, flu) The patient's medication list was reviewed and updated. The height and weight were entered. The patient's current BMI is: 22.6 Waist size: 36.5" Cognitive screening was completed. Outcome of Mini - Cog: PASS  Falls within the past 6 months: NEGATIVE Current tobacco usage: NO (All patients who use tobacco were given written and verbal information on quitting)  Recent listing of emergency department/hospitalizations over the past year were reviewed.  current specialist the patient sees on a regular basis:   Medicare annual wellness visit patient questionnaire was reviewed.  A written screening schedule for the patient for the next 5-10 years was given. Appropriate discussion of followup regarding next visit was discussed.  Glu 97     Review of Systems  Constitutional: Negative for fever, activity change and appetite change.  HENT: Negative for congestion and rhinorrhea.   Eyes: Negative for discharge.  Respiratory: Negative for cough and wheezing.   Cardiovascular: Negative for chest pain.  Gastrointestinal: Negative for vomiting, abdominal pain and blood in stool.  Genitourinary: Negative for frequency and difficulty urinating.  Musculoskeletal: Negative for neck pain.  Skin: Negative for rash.  Allergic/Immunologic: Negative for environmental allergies and food allergies.  Neurological: Negative for weakness and headaches.  Psychiatric/Behavioral: Negative for agitation.       Objective:   Physical Exam  Vitals reviewed. Constitutional: He appears well-developed and well-nourished.  Somewhat frail appearance movements somewhat  slow no tremor  HENT:  Head: Normocephalic and atraumatic.  Right Ear: External ear normal.  Left Ear: External ear normal.  Nose: Nose normal.  Mouth/Throat: Oropharynx is clear and moist.  Eyes: EOM are normal. Pupils are equal, round, and reactive to light.  Neck: Normal range of motion. Neck supple. No thyromegaly present.  Cardiovascular: Normal rate, regular rhythm and normal heart sounds.   No murmur heard. Pulmonary/Chest: Effort normal and breath sounds normal. No respiratory distress. He has no wheezes.  Abdominal: Soft. Bowel sounds are normal. He exhibits no distension and no mass. There is no tenderness.  Genitourinary: Penis normal.  Musculoskeletal: Normal range of motion. He exhibits no edema.  Lymphadenopathy:    He has no cervical adenopathy.  Neurological: He is alert. He exhibits normal muscle tone.  Skin: Skin is warm and dry. No erythema.   Significant patches of [facility]  Psychiatric: He has a normal mood and affect. His behavior is normal. Judgment normal.          Assessment & Plan:  Impression 1 wellness exam #2 type 2 diabetes control good. #3 hyperlipidemia blood work checked in August. #4 hypertension good control. #5 neurological disorder followed by the specialist. Patient reports stable plan diet exercise discussed. Flu shot are given. Maintain same medications. Recheck in several months. WSL

## 2013-10-26 ENCOUNTER — Other Ambulatory Visit: Payer: Self-pay | Admitting: Family Medicine

## 2013-11-27 ENCOUNTER — Other Ambulatory Visit: Payer: Self-pay | Admitting: Family Medicine

## 2013-12-18 ENCOUNTER — Encounter (INDEPENDENT_AMBULATORY_CARE_PROVIDER_SITE_OTHER): Payer: Self-pay

## 2013-12-18 ENCOUNTER — Encounter: Payer: Self-pay | Admitting: Neurology

## 2013-12-18 ENCOUNTER — Ambulatory Visit (INDEPENDENT_AMBULATORY_CARE_PROVIDER_SITE_OTHER): Payer: Medicare Other | Admitting: Neurology

## 2013-12-18 VITALS — BP 148/100 | HR 64 | Wt 152.0 lb

## 2013-12-18 DIAGNOSIS — F0390 Unspecified dementia without behavioral disturbance: Secondary | ICD-10-CM

## 2013-12-18 DIAGNOSIS — F03A0 Unspecified dementia, mild, without behavioral disturbance, psychotic disturbance, mood disturbance, and anxiety: Secondary | ICD-10-CM

## 2013-12-18 NOTE — Progress Notes (Signed)
Reason for visit: Memory disorder  Bradley Reed is an 80 y.o. male  History of present illness:  Bradley Reed is an 80 year old left-handed white male with a history of a progressive memory disorder. The patient was placed on Aricept when he was last seen, but he could not tolerate the medication secondary to increased drowsiness and increased confusion. The patient was taken off the medication. The patient has had ongoing progression of his memory disorder. The patient still operates a motor vehicle on occasion during daytime hours with short trips. No safety issues have been noted. The patient has not given up any activities of daily living secondary to memory. The patient has had no other new medical issues that have come up since last seen.  Past Medical History  Diagnosis Date  . Diabetes mellitus   . Hypertension   . Hypercholesteremia   . Glaucoma   . Diverticulosis   . Prostatism   . Reflux   . Tremor   . Other general symptoms(780.99)   . Other vitamin B12 deficiency anemia   . Essential and other specified forms of tremor   . Abnormality of gait   . Memory loss   . Hearing difficulty of both ears     Bilateral hearing aids  . Macular degeneration     Past Surgical History  Procedure Laterality Date  . Leg surgery    . Tonsillectomy    . Eye surgery      Family History  Problem Relation Age of Onset  . Stroke Mother   . Heart failure Father   . Heart disease Brother   . Arthritis Brother     Social history:  reports that he quit smoking about 50 years ago. His smoking use included Cigarettes. He smoked 0.00 packs per day. He has never used smokeless tobacco. He reports that he drinks about 0.6 ounces of alcohol per week. He reports that he does not use illicit drugs.    Allergies  Allergen Reactions  . Ciprofloxacin   . Bactrim Other (See Comments)    Shaking, patient doesn't recall this allergy     Medications:  Current Outpatient  Prescriptions on File Prior to Visit  Medication Sig Dispense Refill  . aspirin EC 81 MG tablet Take 81 mg by mouth daily.      . bimatoprost (LUMIGAN) 0.01 % SOLN 1 drop at bedtime.      . Brinzolamide-Brimonidine (SIMBRINZA) 1-0.2 % SUSP Apply 1 drop to eye 2 (two) times daily.      . Calcium Carbonate-Vitamin D (CALCIUM 600+D) 600-400 MG-UNIT per tablet Take 1 tablet by mouth 2 (two) times daily.        . fexofenadine (ALLEGRA) 180 MG tablet Take 180 mg by mouth daily.      . fluticasone (FLONASE) 50 MCG/ACT nasal spray Place 2 sprays into the nose daily as needed.      . Glucosamine 500 MG TABS Take 500 mg by mouth 3 (three) times daily.      Marland Kitchen lisinopril (PRINIVIL,ZESTRIL) 40 MG tablet TAKE ONE TABLET BY MOUTH ONCE DAILY.  30 tablet  2  . metFORMIN (GLUCOPHAGE) 1000 MG tablet TAKE 1 TABLET BY MOUTH TWICE DAILY WITH MEALS.  60 tablet  1  . Multiple Vitamins-Minerals (VITEYES AREDS FORMULA PO) Take 1 tablet by mouth 2 (two) times daily.       . pravastatin (PRAVACHOL) 80 MG tablet TAKE ONE TABLET BY MOUTH ONCE DAILY.  30 tablet  2  .  senna (SENOKOT) 8.6 MG TABS Take 1 tablet by mouth daily as needed.      . vitamin B-12 (CYANOCOBALAMIN) 100 MCG tablet Take 100 mcg by mouth 2 (two) times daily.        . vitamin C (ASCORBIC ACID) 500 MG tablet Take 500 mg by mouth daily.      . Bevacizumab (AVASTIN IV) by Intravitreal route as directed. Every 7 to 9 weeks (for Macular Degeneration)       No current facility-administered medications on file prior to visit.    ROS:  Out of a complete 14 system review of symptoms, the patient complains only of the following symptoms, and all other reviewed systems are negative.  Constipation Memory disturbance Anxiety  Blood pressure 148/100, pulse 64, weight 152 lb (68.947 kg).  Physical Exam  General: The patient is alert and cooperative at the time of the examination.  Skin: No significant peripheral edema is noted.   Neurologic Exam  Mental  status: The Mini-Mental status examination done today shows a total score of 19/30.  Cranial nerves: Facial symmetry is present. Speech is normal, no aphasia or dysarthria is noted. Extraocular movements are full. Visual fields are full.  Motor: The patient has good strength in all 4 extremities.  Sensory examination: Soft touch sensation is symmetric on the face, arms, and legs.  Coordination: The patient has good finger-nose-finger and heel-to-shin bilaterally. Some apraxia with the use of the extremities is noted.  Gait and station: The patient has a normal gait. Tandem gait is normal. Romberg is negative. No drift is seen.  Reflexes: Deep tendon reflexes are symmetric.   Assessment/Plan:  1. Progressive dementia  The patient does not wish to go on another medication for memory at this time. The patient will followup through this office in 9 months. The driving issue will need to be scrutinized closely, as the patient likely need to give up driving in the near future. The patient will contact our office if he decides that he wants to go on a medication for memory, and we will give a trial on Exelon.  Marlan Palau. Keith Yarianna Varble MD 12/18/2013 7:33 PM  [facility] [address] [place], [place] [id number]  Phone [phone] Fax [phone]

## 2014-01-08 ENCOUNTER — Ambulatory Visit (INDEPENDENT_AMBULATORY_CARE_PROVIDER_SITE_OTHER): Payer: Medicare Other | Admitting: Family Medicine

## 2014-01-08 ENCOUNTER — Encounter: Payer: Self-pay | Admitting: Family Medicine

## 2014-01-08 VITALS — BP 110/80 | Ht 69.0 in | Wt 154.0 lb

## 2014-01-08 DIAGNOSIS — F0390 Unspecified dementia without behavioral disturbance: Secondary | ICD-10-CM

## 2014-01-08 DIAGNOSIS — E7800 Pure hypercholesterolemia, unspecified: Secondary | ICD-10-CM

## 2014-01-08 DIAGNOSIS — E119 Type 2 diabetes mellitus without complications: Secondary | ICD-10-CM

## 2014-01-08 DIAGNOSIS — I10 Essential (primary) hypertension: Secondary | ICD-10-CM

## 2014-01-08 DIAGNOSIS — Z79899 Other long term (current) drug therapy: Secondary | ICD-10-CM

## 2014-01-08 DIAGNOSIS — F03A0 Unspecified dementia, mild, without behavioral disturbance, psychotic disturbance, mood disturbance, and anxiety: Secondary | ICD-10-CM

## 2014-01-08 DIAGNOSIS — E782 Mixed hyperlipidemia: Secondary | ICD-10-CM

## 2014-01-08 LAB — POCT GLYCOSYLATED HEMOGLOBIN (HGB A1C): HEMOGLOBIN A1C: 5.4

## 2014-01-08 MED ORDER — METFORMIN HCL 500 MG PO TABS
500.0000 mg | ORAL_TABLET | Freq: Two times a day (BID) | ORAL | Status: DC
Start: 2014-01-08 — End: 2014-02-28

## 2014-01-08 NOTE — Progress Notes (Signed)
   Subjective:    Patient ID: Bradley Reed, male    DOB: 03/26/1924, 80 y.o.   MRN: [id number]  Diabetes He presents for his follow-up diabetic visit. He has type 2 diabetes mellitus. His disease course has been stable. There are no hypoglycemic associated symptoms. There are no diabetic associated symptoms. There are no hypoglycemic complications. Symptoms are stable. There are no diabetic complications. There are no known risk factors for coronary artery disease. Current diabetic treatment includes oral agent (monotherapy). He is compliant with treatment all of the time.  Patient states that he has no other concerns that need to be addressed at today's visit.   Results for orders placed in visit on 01/08/14  POCT GLYCOSYLATED HEMOGLOBIN (HGB A1C)      Result Value Ref Range   Hemoglobin A1C 5.4     Saw neurlogist lately, no new meds recommended notes overall that mental status is stable. Feels that his forgetfulness has not worsened.  Compliant with his medication. For his blood pressure. No obvious side effects. Trying to watch his salt intake. Blood pressures generally good when checked elsewhere.  Compliant with his lipid medicines also  No low sugar spells  Staying active, not missing chance to exrcise  Review of Systems  no chest pain no headache no back pain no loss of consciousness no change in bowel habits no blood in stool ROS otherwise negative    Objective:   Physical Exam Alert HEENT normal blood pressure good on repeat. Lungs clear. Heart regular in rhythm. Ankles without edema. Pulses intact sensation good.       Assessment & Plan:  Impression 1 type 2 diabetes controlled good discuss 2 hypertension good control. #3 hyperlipidemia clinically stable. #4 early dementia clinically stable plan adjust medication as noted. Diet exercise discussed. Recheck in several months. WSL

## 2014-01-10 LAB — HEPATIC FUNCTION PANEL
ALBUMIN: 4.2 g/dL (ref 3.5–5.2)
ALT: 10 U/L (ref 0–53)
AST: 19 U/L (ref 0–37)
Alkaline Phosphatase: 60 U/L (ref 39–117)
Bilirubin, Direct: 0.1 mg/dL (ref 0.0–0.3)
Indirect Bilirubin: 0.5 mg/dL (ref 0.2–1.2)
TOTAL PROTEIN: 7.4 g/dL (ref 6.0–8.3)
Total Bilirubin: 0.6 mg/dL (ref 0.2–1.2)

## 2014-01-10 LAB — LIPID PANEL
CHOL/HDL RATIO: 2.9 ratio
Cholesterol: 160 mg/dL (ref 0–200)
HDL: 55 mg/dL (ref >39)
LDL Cholesterol: 88 mg/dL (ref 0–99)
Triglycerides: 86 mg/dL (ref <150)
VLDL: 17 mg/dL (ref 0–40)

## 2014-01-10 LAB — BASIC METABOLIC PANEL
BUN: 29 mg/dL — ABNORMAL HIGH (ref 6–23)
CALCIUM: 9.6 mg/dL (ref 8.4–10.5)
CO2: 27 mEq/L (ref 19–32)
Chloride: 102 mEq/L (ref 96–112)
Creat: 1.28 mg/dL (ref 0.50–1.35)
Glucose, Bld: 121 mg/dL — ABNORMAL HIGH (ref 70–99)
Potassium: 4.4 mEq/L (ref 3.5–5.3)
Sodium: 137 mEq/L (ref 135–145)

## 2014-01-11 LAB — MICROALBUMIN, URINE: MICROALB UR: 0.63 mg/dL (ref 0.00–1.89)

## 2014-01-13 ENCOUNTER — Encounter: Payer: Self-pay | Admitting: Family Medicine

## 2014-02-15 ENCOUNTER — Other Ambulatory Visit: Payer: Self-pay | Admitting: Family Medicine

## 2014-02-28 ENCOUNTER — Other Ambulatory Visit: Payer: Self-pay | Admitting: Family Medicine

## 2014-05-15 ENCOUNTER — Encounter: Payer: Self-pay | Admitting: Family Medicine

## 2014-05-15 ENCOUNTER — Ambulatory Visit (INDEPENDENT_AMBULATORY_CARE_PROVIDER_SITE_OTHER): Payer: Medicare Other | Admitting: Family Medicine

## 2014-05-15 VITALS — BP 112/74 | Ht 69.0 in | Wt 154.0 lb

## 2014-05-15 DIAGNOSIS — F03A0 Unspecified dementia, mild, without behavioral disturbance, psychotic disturbance, mood disturbance, and anxiety: Secondary | ICD-10-CM

## 2014-05-15 DIAGNOSIS — F0390 Unspecified dementia without behavioral disturbance: Secondary | ICD-10-CM

## 2014-05-15 DIAGNOSIS — E119 Type 2 diabetes mellitus without complications: Secondary | ICD-10-CM

## 2014-05-15 DIAGNOSIS — Z23 Encounter for immunization: Secondary | ICD-10-CM

## 2014-05-15 DIAGNOSIS — E1165 Type 2 diabetes mellitus with hyperglycemia: Principal | ICD-10-CM

## 2014-05-15 DIAGNOSIS — IMO0001 Reserved for inherently not codable concepts without codable children: Secondary | ICD-10-CM

## 2014-05-15 DIAGNOSIS — E7800 Pure hypercholesterolemia, unspecified: Secondary | ICD-10-CM

## 2014-05-15 DIAGNOSIS — I10 Essential (primary) hypertension: Secondary | ICD-10-CM

## 2014-05-15 LAB — POCT GLYCOSYLATED HEMOGLOBIN (HGB A1C): Hemoglobin A1C: 6.3

## 2014-05-15 NOTE — Progress Notes (Signed)
   Subjective:    Patient ID: Bradley Reed, male    DOB: 10/13/1924, 80 y.o.   MRN: [id number]  HPIDifficulty walking. Requesting referral to a different neurologist. [facility] In an in patient still difficulty with short-term memory loss. Patient has been diagnosed with mild dementia It was on a trial of Aricept. This has not helped very much. Patient has had slow decline of memory skills. Also progressive difficulty with gait. Also prominent times with tremor and limitation of motion. Please see Dr. Anne HahnWillis is prior notes. Family would like to try a different neurologist at this time.  History of hypertension. Compliant with medication. Watching salt intake. Exercising regularly.  Has seen an eye Dr. this year. Watching his sugar intake. Compliant with diabetes medicine.  Compliant with her lipid medication. No obvious difficulties with the medication. Trying to watch fats in the diet.  Patient reports reflux is clinically stable    Review of Systems No headache no chest pain no back pain no incontinence no change in bowel habits no blood in stool no rash ROS otherwise negative.    Objective:   Physical Exam  Alert no apparent distress. Lungs clear. Heart regular i HEENT normal. Neuro exam fine tremor noted. Cerebellar function grossly intact slight tremor on finger to nose motion. Fine motor controls slow but intact gait also slow. No true muscular rigidity. No cough no rigidity. Deep tendon reflexes intact. Lungs clear. Heart rare in rhythm. Abdomen benign. C. diabetic foot exam.  Results for orders placed in visit on 05/15/14  POCT GLYCOSYLATED HEMOGLOBIN (HGB A1C)      Result Value Ref Range   Hemoglobin A1C 6.3     n rhythm.  impression #1 progressive dementia. Family frustrated and would like a new neurologist weigh-in on patient's management. Wondering if any other medicines may be of benefit. Dr. Anne HahnWillis had mentioned Exelon in  his last note. #2 type 2 diabetes control good. Discussed. #3 hypertension clinically stable. #4 hyperlipidemia stable. Plan long discussion held about current medications. Patient no longer driving. Still exercising. We'll work on Ambulance personneurologist referral. Prevnar today. Benefits side effects discussed. Diet discussed. 35-40 minutes spent most in discussion. WSL      Assessment& impression see above see above Plan:

## 2014-06-03 ENCOUNTER — Encounter: Payer: Self-pay | Admitting: Neurology

## 2014-06-03 ENCOUNTER — Ambulatory Visit (INDEPENDENT_AMBULATORY_CARE_PROVIDER_SITE_OTHER): Payer: Medicare Other | Admitting: Neurology

## 2014-06-03 VITALS — BP 102/60 | HR 68 | Temp 97.8°F | Resp 16 | Ht 69.0 in | Wt 152.8 lb

## 2014-06-03 DIAGNOSIS — G250 Essential tremor: Secondary | ICD-10-CM

## 2014-06-03 DIAGNOSIS — F0280 Dementia in other diseases classified elsewhere without behavioral disturbance: Secondary | ICD-10-CM | POA: Insufficient documentation

## 2014-06-03 DIAGNOSIS — G252 Other specified forms of tremor: Secondary | ICD-10-CM

## 2014-06-03 DIAGNOSIS — G309 Alzheimer's disease, unspecified: Principal | ICD-10-CM

## 2014-06-03 MED ORDER — RIVASTIGMINE 4.6 MG/24HR TD PT24: 4.6000 mg | MEDICATED_PATCH | Freq: Every day | TRANSDERMAL | Status: DC

## 2014-06-03 NOTE — Patient Instructions (Signed)
I think you have Alzheimer's disease 1.  We will try the Exelon patch.  We will start with the 4.6mg  dose.  Apply 1 patch daily (the next day replace with a new patch).  Side effects may include fatigue, dizziness, nausea, vivid dreams.  In 4 weeks, call and if you are tolerating it, we can increase to the 9.5mg  dose. 2.  Continue walks, puzzles, reading 3.  Follow up in 6 months.

## 2014-06-03 NOTE — Progress Notes (Signed)
NEUROLOGY CONSULTATION NOTE  Otila BackKenneth W Mabie MRN: [id number] DOB: 07/23/1924  Referring provider: Dr. Gerda DissLuking Primary care provider: Dr. Gerda DissLuking  Reason for consult:  Dementia  HISTORY OF PRESENT ILLNESS: Bradley Reed is an [age] year old left-handed man with history of diabetes, hypertension, hypercholesterolemia, glaucoma, macular degeneration, tremor and hearing loss who presents regarding dementia.  [facility]  Records and images reviewed.  He is accompanied by his wife and son-in-law.  Symptoms started approximately 2 years ago. At that time, he began having memory problems. It has gradually progressed over the last couple of years. Sometimes he is a little disoriented when he is out, even in familiar areas. He doesn't remember directions sometimes even on familiar routes. His wife also has some memory problems. He and his wife no longer drive.  He has to write everything down on the calendar to help him remember his appointments. This method definitely works. He also uses a pillbox to help him remember to take his medications correctly, which he does. He actually has corrected the pharmacy regarding mistakes on his medications. Most of that his bills are paid via automatic payment system. However, his wife will pay any other bills. He does perform chores at home, such as mowing the lawn or splitting wood. He is afraid she has read or although he does have trouble remembering what he has read. He does plays sodoku. He has not experienced any changes in personality. He does get frustrated when he can't remember things. He denies depression. His appetite is good. He sleeps well. He also has had change in his gait. His wife says that he seems to shuffle more. He also has a tremor or in his hands as well. He has not had any falls. There is no known family history of any neurological disorders.  He was previously on Aricept, but it was discontinued due to  causing increased drowsiness and confusion.  03/19/11 MRI BRAIN WO:  diffuse atrophy and microvascular ischemic changes. 03/11/13 CT HEAD WO:  diffuse atrophy and small vessel ischemic changes. 05/15/14 HGB A1c 6.3  PAST MEDICAL HISTORY: Past Medical History  Diagnosis Date  . Diabetes mellitus   . Hypertension   . Hypercholesteremia   . Glaucoma   . Diverticulosis   . Prostatism   . Reflux   . Tremor   . Other general symptoms(780.99)   . Other vitamin B12 deficiency anemia   . Essential and other specified forms of tremor   . Abnormality of gait   . Memory loss   . Hearing difficulty of both ears     Bilateral hearing aids  . Macular degeneration     PAST SURGICAL HISTORY: Past Surgical History  Procedure Laterality Date  . Leg surgery    . Tonsillectomy    . Eye surgery      MEDICATIONS: Current Outpatient Prescriptions on File Prior to Visit  Medication Sig Dispense Refill  . aspirin EC 81 MG tablet Take 81 mg by mouth daily.      . fexofenadine (ALLEGRA) 180 MG tablet Take 180 mg by mouth daily.      . Glucosamine 500 MG TABS Take 500 mg by mouth 3 (three) times daily.      Marland Kitchen. lisinopril (PRINIVIL,ZESTRIL) 40 MG tablet TAKE ONE TABLET BY MOUTH ONCE DAILY.  30 tablet  3  . metFORMIN (GLUCOPHAGE) 500 MG tablet TAKE 1 TABLET BY MOUTH TWICE DAILY WITH MEALS.  60 tablet  5  . Multiple Vitamins-Minerals (VITEYES AREDS FORMULA PO) Take 1 tablet by mouth 2 (two) times daily.       . pravastatin (PRAVACHOL) 80 MG tablet TAKE ONE TABLET BY MOUTH ONCE DAILY.  30 tablet  3  . senna (SENOKOT) 8.6 MG TABS Take 1 tablet by mouth daily as needed.      . vitamin B-12 (CYANOCOBALAMIN) 100 MCG tablet Take 100 mcg by mouth 2 (two) times daily.        . vitamin C (ASCORBIC ACID) 500 MG tablet Take 500 mg by mouth daily.      . bimatoprost (LUMIGAN) 0.01 % SOLN 1 drop at bedtime.      . Calcium Carbonate-Vitamin D (CALCIUM 600+D) 600-400 MG-UNIT per tablet Take 1 tablet by mouth 2 (two)  times daily.        . fluticasone (FLONASE) 50 MCG/ACT nasal spray Place 2 sprays into the nose daily as needed.       No current facility-administered medications on file prior to visit.    ALLERGIES: Allergies  Allergen Reactions  . Ciprofloxacin   . Bactrim Other (See Comments)    Shaking, patient doesn't recall this allergy     FAMILY HISTORY: Family History  Problem Relation Age of Onset  . Stroke Mother   . Heart failure Father   . Heart disease Brother   . Arthritis Brother     SOCIAL HISTORY: History   Social History  . Marital Status: Married    Spouse Name: N/A    Number of Children: 3  . Years of Education: PHD   Occupational History  . Teacher     Retired   Social History Main Topics  . Smoking status: Former Smoker    Types: Cigarettes    Quit date: 03/17/1963  . Smokeless tobacco: Never Used  . Alcohol Use: 0.6 oz/week    1 Glasses of wine per week     Comment: occ  . Drug Use: No  . Sexual Activity: Not Currently   Other Topics Concern  . Not on file   Social History Narrative   Patient is married and lives with his wife. Patient worked as a Runner, broadcasting/film/video for [employer].   Patient has his PHD degree.    REVIEW OF SYSTEMS: Constitutional: No fevers, chills, or sweats, no generalized fatigue, change in appetite Eyes: No visual changes, double vision, eye pain Ear, nose and throat: No hearing loss, ear pain, nasal congestion, sore throat Cardiovascular: No chest pain, palpitations Respiratory:  No shortness of breath at rest or with exertion, wheezes GastrointestinaI: No nausea, vomiting, diarrhea, abdominal pain, fecal incontinence Genitourinary:  No dysuria, urinary retention or frequency Musculoskeletal:  No neck pain, back pain Integumentary: No rash, pruritus, skin lesions Neurological: as above Psychiatric: No depression, insomnia, anxiety Endocrine: No palpitations, fatigue, diaphoresis, mood swings, change in appetite,  change in weight, increased thirst Hematologic/Lymphatic:  No anemia, purpura, petechiae. Allergic/Immunologic: no itchy/runny eyes, nasal congestion, recent allergic reactions, rashes  PHYSICAL EXAM: Filed Vitals:   06/03/14 1035  BP: 102/60  Pulse: 68  Temp: 97.8 F (36.6 C)  Resp: 16   General: No acute distress Head:  Normocephalic/atraumatic Neck: supple, no paraspinal tenderness, full range of motion Back: No paraspinal tenderness Heart: regular rate and rhythm Lungs: Clear to auscultation bilaterally. Vascular: No carotid bruits. Neurological Exam: Mental status: Montreal Cognitive Assessment  06/03/2014  Visuospatial/ Executive (0/5) 4  Naming (0/3) 2  Attention: Read list of digits (0/2) 2  Attention: Read list of letters (0/1) 1  Attention: Serial 7 subtraction starting at 100 (0/3) 3  Language: Repeat phrase (0/2) 0  Language : Fluency (0/1) 0  Abstraction (0/2) 1  Delayed Recall (0/5) 0  Orientation (0/6) 4  Total 17   remote memory intact, fund of knowledge intact, attention and concentration intact, speech hypophonic but fluent and not dysarthric. Cranial nerves: CN I: not tested CN II: pupils equal, round and reactive to light, visual fields intact, fundi unremarkable, without vessel changes, exudates, hemorrhages or papilledema. CN III, IV, VI:  full range of motion, no nystagmus, no ptosis CN V: facial sensation intact CN VII: upper and lower face symmetric CN VIII: hearing intact CN IX, X: gag intact, uvula midline CN XI: sternocleidomastoid and trapezius muscles intact CN XII: tongue midline Bulk & Tone: normal, no fasciculations. Motor: Reduced finger tapping speed and amplitude in both hands. Mild cogwheel rigidity in the wrists. No resting tremor. Postural and kinetic tremor of the wrists noted. Sensation: Pinprick sensation intact. Reduced vibration sensation in the toes. Deep Tendon Reflexes: 2+ throughout except absent in the ankles. Toes  downgoing. Finger to nose testing: Postural and kinetic tremor bilaterally. No dysmetria. Heel to shin: No dysmetria. Gait: Upright posture with good armswing. Slowed stride but not shuffling. Able to turn around and couple of steps. Unable to walk in tandem. Romberg negative.  IMPRESSION: Probable Alzheimer's dementia Essential tremor.  He is somewhat bradykinetic but I do not suspect Parkinson's disease.  PLAN: 1. We'll try Exelon patch 4.6 mg daily. If he is tolerating in 4 weeks, we will increase to 9.3 mg daily. 2. Encourage to continue puzzles, reading, and walks.  Thank you for allowing me to take part in the care of this patient.  Shon Millet, DO  CC: Lilyan Punt, MD

## 2014-06-25 ENCOUNTER — Other Ambulatory Visit: Payer: Self-pay | Admitting: Family Medicine

## 2014-07-04 ENCOUNTER — Telehealth: Payer: Self-pay | Admitting: Neurology

## 2014-07-04 ENCOUNTER — Other Ambulatory Visit: Payer: Self-pay | Admitting: Neurology

## 2014-07-04 NOTE — Telephone Encounter (Signed)
See below note.

## 2014-07-04 NOTE — Telephone Encounter (Signed)
Pt's wife wanting to let Dr. Everlena Cooper know that the patches he Rx are working great and can tell a big difference.  C/B [phone]

## 2014-08-02 ENCOUNTER — Other Ambulatory Visit: Payer: Self-pay | Admitting: Family Medicine

## 2014-08-13 ENCOUNTER — Encounter: Payer: Self-pay | Admitting: Family Medicine

## 2014-08-13 ENCOUNTER — Ambulatory Visit (INDEPENDENT_AMBULATORY_CARE_PROVIDER_SITE_OTHER): Payer: Medicare Other | Admitting: Family Medicine

## 2014-08-13 VITALS — BP 116/74 | Ht 69.0 in | Wt 154.0 lb

## 2014-08-13 DIAGNOSIS — E118 Type 2 diabetes mellitus with unspecified complications: Secondary | ICD-10-CM

## 2014-08-13 DIAGNOSIS — Z79899 Other long term (current) drug therapy: Secondary | ICD-10-CM

## 2014-08-13 DIAGNOSIS — E7800 Pure hypercholesterolemia, unspecified: Secondary | ICD-10-CM

## 2014-08-13 DIAGNOSIS — I10 Essential (primary) hypertension: Secondary | ICD-10-CM

## 2014-08-13 NOTE — Progress Notes (Signed)
   Subjective:    Patient ID: Bradley Reed, male    DOB: 12/16/1923, 80 y.o.   MRN: [id number]  Diabetes He presents for his follow-up diabetic visit. He has type 2 diabetes mellitus. His disease course has been stable. There are no hypoglycemic associated symptoms. There are no diabetic associated symptoms. There are no hypoglycemic complications. Symptoms are stable. There are no diabetic complications. There are no known risk factors for coronary artery disease. Current diabetic treatment includes oral agent (monotherapy). He is compliant with treatment all of the time.  Patient is 2 days early for his A1C last one 05/15/14.  Results for orders placed in visit on 05/15/14  POCT GLYCOSYLATED HEMOGLOBIN (HGB A1C)      Result Value Ref Range   Hemoglobin A1C 6.3     Not cking morn sugars  BP overall good, compliant with medication. No obvious side effects. Has cut down salt intake.  Sticking with cholesterol medication. Has cut that down. Exercising some. No side effects from medications.  Salt a neurologist a new one which the family is pleased with. Current diagnosis of her early dementia  Review of Systems No headache no chest pain no back pain no abdominal pain ROS otherwise negative    Objective:   Physical Exam Alert no acute distress vital stable. Lungs clear. Heart regular in rhythm. Ankles without edema.       Assessment & Plan:  Impression #1 type 2 diabetes good control #2 hypertension good control. #3 hyperlipidemia status uncertain. #4 early dementia and discussed plan diet exercise discussed. Appropriate blood work. Maintain same medications. Recheck in several months. WSL

## 2014-08-14 LAB — HEPATIC FUNCTION PANEL
ALBUMIN: 4.4 g/dL (ref 3.5–5.2)
ALT: 11 U/L (ref 0–53)
AST: 21 U/L (ref 0–37)
Alkaline Phosphatase: 54 U/L (ref 39–117)
BILIRUBIN DIRECT: 0.2 mg/dL (ref 0.0–0.3)
BILIRUBIN TOTAL: 0.8 mg/dL (ref 0.2–1.2)
Indirect Bilirubin: 0.6 mg/dL (ref 0.2–1.2)
Total Protein: 7.6 g/dL (ref 6.0–8.3)

## 2014-08-14 LAB — HEMOGLOBIN A1C
Hgb A1c MFr Bld: 6.7 % — ABNORMAL HIGH (ref <5.7)
MEAN PLASMA GLUCOSE: 146 mg/dL — AB (ref <117)

## 2014-08-14 LAB — LIPID PANEL
CHOL/HDL RATIO: 3.1 ratio
Cholesterol: 176 mg/dL (ref 0–200)
HDL: 56 mg/dL (ref >39)
LDL Cholesterol: 99 mg/dL (ref 0–99)
Triglycerides: 104 mg/dL (ref <150)
VLDL: 21 mg/dL (ref 0–40)

## 2014-08-15 ENCOUNTER — Ambulatory Visit: Payer: Medicare Other | Admitting: Family Medicine

## 2014-08-21 ENCOUNTER — Emergency Department (HOSPITAL_COMMUNITY)
Admission: EM | Admit: 2014-08-21 | Discharge: 2014-08-22 | Disposition: A | Discharge status: Home / Self Care | Payer: Medicare Other | Attending: Emergency Medicine | Admitting: Emergency Medicine

## 2014-08-21 ENCOUNTER — Emergency Department (HOSPITAL_COMMUNITY): Payer: Medicare Other

## 2014-08-21 ENCOUNTER — Encounter (HOSPITAL_COMMUNITY): Payer: Self-pay | Admitting: Emergency Medicine

## 2014-08-21 DIAGNOSIS — I10 Essential (primary) hypertension: Secondary | ICD-10-CM | POA: Insufficient documentation

## 2014-08-21 DIAGNOSIS — Z87891 Personal history of nicotine dependence: Secondary | ICD-10-CM | POA: Insufficient documentation

## 2014-08-21 DIAGNOSIS — E780 Pure hypercholesterolemia: Secondary | ICD-10-CM | POA: Diagnosis not present

## 2014-08-21 DIAGNOSIS — E119 Type 2 diabetes mellitus without complications: Secondary | ICD-10-CM | POA: Insufficient documentation

## 2014-08-21 DIAGNOSIS — Z8669 Personal history of other diseases of the nervous system and sense organs: Secondary | ICD-10-CM | POA: Diagnosis not present

## 2014-08-21 DIAGNOSIS — Z79899 Other long term (current) drug therapy: Secondary | ICD-10-CM | POA: Insufficient documentation

## 2014-08-21 DIAGNOSIS — Z862 Personal history of diseases of the blood and blood-forming organs and certain disorders involving the immune mechanism: Secondary | ICD-10-CM | POA: Diagnosis not present

## 2014-08-21 DIAGNOSIS — F0390 Unspecified dementia without behavioral disturbance: Secondary | ICD-10-CM | POA: Insufficient documentation

## 2014-08-21 DIAGNOSIS — R0789 Other chest pain: Secondary | ICD-10-CM | POA: Insufficient documentation

## 2014-08-21 DIAGNOSIS — Z7982 Long term (current) use of aspirin: Secondary | ICD-10-CM | POA: Diagnosis not present

## 2014-08-21 DIAGNOSIS — R079 Chest pain, unspecified: Secondary | ICD-10-CM | POA: Diagnosis present

## 2014-08-21 LAB — BASIC METABOLIC PANEL
Anion gap: 14 (ref 5–15)
BUN: 32 mg/dL — ABNORMAL HIGH (ref 6–23)
CO2: 27 mEq/L (ref 19–32)
Calcium: 9.9 mg/dL (ref 8.4–10.5)
Chloride: 95 mEq/L — ABNORMAL LOW (ref 96–112)
Creatinine, Ser: 1.33 mg/dL (ref 0.50–1.35)
GFR calc Af Amer: 53 mL/min — ABNORMAL LOW (ref >90)
GFR, EST NON AFRICAN AMERICAN: 46 mL/min — AB (ref >90)
Glucose, Bld: 142 mg/dL — ABNORMAL HIGH (ref 70–99)
Potassium: 4.1 mEq/L (ref 3.7–5.3)
SODIUM: 136 meq/L — AB (ref 137–147)

## 2014-08-21 LAB — CBC
HCT: 40 % (ref 39.0–52.0)
Hemoglobin: 14 g/dL (ref 13.0–17.0)
MCH: 34.3 pg — ABNORMAL HIGH (ref 26.0–34.0)
MCHC: 35 g/dL (ref 30.0–36.0)
MCV: 98 fL (ref 78.0–100.0)
PLATELETS: 204 10*3/uL (ref 150–400)
RBC: 4.08 MIL/uL — ABNORMAL LOW (ref 4.22–5.81)
RDW: 13.4 % (ref 11.5–15.5)
WBC: 9.2 10*3/uL (ref 4.0–10.5)

## 2014-08-21 LAB — TROPONIN I: Troponin I: <0.3 ng/mL (ref <0.30)

## 2014-08-21 MED ORDER — ASPIRIN 81 MG PO CHEW
324.0000 mg | CHEWABLE_TABLET | Freq: Once | ORAL | Status: AC
  Administered 2014-08-21: 324 mg via ORAL
  Filled 2014-08-21: qty 4

## 2014-08-21 NOTE — ED Notes (Signed)
Reports 20-6430mins prior to arrival started with generalized cp and trembling. Pt reports , " I just don';t feel right"

## 2014-08-21 NOTE — ED Provider Notes (Signed)
CSN: [id number]     Arrival date & time 08/21/14  2147 History  This chart was scribed for Ward GivensIva L Abu Heavin, MD by Murriel HopperAlec Bankhead, ED Scribe. This patient was seen in room APA02/APA02 and the patient's care was started at 11:09 PM.     Chief Complaint  Patient presents with  . Chest Pain     (Consider location/radiation/quality/duration/timing/severity/associated sxs/prior Treatment) HPI  HPI Comments: Bradley Reed is a 80 y.o. male who presents to the Emergency Department complaining of chest pressure that began around 9 pm. Pain was localized to the left upper chest and did not radiate. His wife states it started after eating a light dinner. The pain which was described as pressure lasted approximately 30 minutes. No SOB, diaphoresis, nausea or vomiting. Nothing that he noted caused or alleviated the pain. Pt states that chest pressure sensation has never occurred before, and is currently under no immediate distress. Pt rated pressure in chest as "very light" at it's worse, but is gone now. Pt is not on oxygen at home, and uses a cane to ambulate at home. Pt denies history of heart problems, and has never been to a cardiologist. Pt notes that he started shaking when the pressure occurred, and continued shaking until he arrived at the hospital.  PCP Dr Gerda DissLuking  Past Medical History  Diagnosis Date  . Diabetes mellitus   . Hypertension   . Hypercholesteremia   . Glaucoma   . Diverticulosis   . Prostatism   . Reflux   . Tremor   . Other general symptoms(780.99)   . Other vitamin B12 deficiency anemia   . Essential and other specified forms of tremor   . Abnormality of gait   . Memory loss   . Hearing difficulty of both ears     Bilateral hearing aids  . Macular degeneration    dementia   Past Surgical History  Procedure Laterality Date  . Leg surgery    . Tonsillectomy    . Eye surgery     Family History  Problem Relation Age of Onset  . Stroke Mother   . Heart failure  Father   . Heart disease Brother   . Arthritis Brother    History  Substance Use Topics  . Smoking status: Former Smoker    Types: Cigarettes    Quit date: 03/17/1963  . Smokeless tobacco: Never Used  . Alcohol Use: 0.6 oz/week    1 Glasses of wine per week     Comment: occ  lives at home Lives with spouse Uses a cane  Review of Systems  All other systems reviewed and are negative.     Allergies  Ciprofloxacin and Bactrim  Home Medications   Prior to Admission medications   Medication Sig Start Date End Date Taking? Authorizing Provider  aspirin EC 81 MG tablet Take 81 mg by mouth daily.   Yes Historical Provider, MD  bimatoprost (LUMIGAN) 0.01 % SOLN Place 1 drop into both eyes at bedtime.    Yes Historical Provider, MD  Calcium Carbonate-Vitamin D (CALCIUM 600+D) 600-400 MG-UNIT per tablet Take 1 tablet by mouth 2 (two) times daily.     Yes Historical Provider, MD  fexofenadine (ALLEGRA) 180 MG tablet Take 180 mg by mouth 2 (two) times daily.    Yes Historical Provider, MD  Glucosamine HCl 1500 MG TABS Take 1,500 mg by mouth daily.   Yes Historical Provider, MD  lisinopril (PRINIVIL,ZESTRIL) 40 MG tablet Take 40 mg by mouth  daily.   Yes Historical Provider, MD  metFORMIN (GLUCOPHAGE) 500 MG tablet Take 500 mg by mouth 2 (two) times daily.   Yes Historical Provider, MD  Multiple Vitamins-Minerals (VITEYES AREDS FORMULA PO) Take 1 tablet by mouth 2 (two) times daily.    Yes Historical Provider, MD  pravastatin (PRAVACHOL) 40 MG tablet Take 40 mg by mouth at bedtime.   Yes Historical Provider, MD  rivastigmine (EXELON) 4.6 mg/24hr Place 4.6 mg onto the skin daily.   Yes Historical Provider, MD  SIMBRINZA 1-0.2 % SUSP Place 1 drop into both eyes 2 (two) times daily. 08/21/14  Yes Historical Provider, MD  vitamin B-12 (CYANOCOBALAMIN) 100 MCG tablet Take 100 mcg by mouth 2 (two) times daily.     Yes Historical Provider, MD  vitamin C (ASCORBIC ACID) 500 MG tablet Take 500 mg by  mouth daily.   Yes Historical Provider, MD  senna (SENOKOT) 8.6 MG TABS Take 1 tablet by mouth daily as needed (constipation).     Historical Provider, MD   BP 158/92  Pulse 71  Resp 22  SpO2 99%  Vital signs normal   Physical Exam  Nursing note and vitals reviewed. Constitutional: He is oriented to person, place, and time. He appears well-developed and well-nourished.  Non-toxic appearance. He does not appear ill. No distress.  HENT:  Head: Normocephalic and atraumatic.  Right Ear: External ear normal.  Left Ear: External ear normal.  Nose: Nose normal. No mucosal edema or rhinorrhea.  Mouth/Throat: Oropharynx is clear and moist and mucous membranes are normal. No dental abscesses or uvula swelling.  Eyes: Conjunctivae and EOM are normal. Pupils are equal, round, and reactive to light. No scleral icterus.  Neck: Normal range of motion and full passive range of motion without pain. Neck supple. No thyromegaly present.  Cardiovascular: Normal rate, regular rhythm and normal heart sounds.  Exam reveals no gallop and no friction rub.   No murmur heard. Pulmonary/Chest: Effort normal and breath sounds normal. No stridor. No respiratory distress. He has no wheezes. He has no rhonchi. He has no rales. He exhibits no tenderness and no crepitus.    Area of pain noted.   Abdominal: Soft. Normal appearance and bowel sounds are normal. He exhibits no distension. There is no tenderness. There is no rebound and no guarding.  Musculoskeletal: Normal range of motion. He exhibits no edema and no tenderness.  Moves all extremities well.   Lymphadenopathy:    He has no cervical adenopathy.  Neurological: He is alert and oriented to person, place, and time. He has normal strength. No cranial nerve deficit. He exhibits normal muscle tone. Coordination normal.  Skin: Skin is warm, dry and intact. No rash noted. No erythema. No pallor.  Psychiatric: He has a normal mood and affect. His speech is normal  and behavior is normal. His mood appears not anxious.    ED Course  Procedures (including critical care time)  Medications  aspirin chewable tablet 324 mg (324 mg Oral Given 08/21/14 2321)     DIAGNOSTIC STUDIES: Oxygen Saturation is 98% on room air, normal by my interpretation.    COORDINATION OF CARE: 11:09 PM Discussed treatment plan with pt at bedside and pt agreed to plan.  Patient remained pain-free during his ED visit. His second troponin was a three-hour troponin. His pain was atypical. At this point he will be discharged. He will be advised to return if the pain returns and to followup with his primary care doctor.  Labs Review Results for orders placed during the hospital encounter of 08/21/14  BASIC METABOLIC PANEL      Result Value Ref Range   Sodium 136 (*) 137 - 147 mEq/L   Potassium 4.1  3.7 - 5.3 mEq/L   Chloride 95 (*) 96 - 112 mEq/L   CO2 27  19 - 32 mEq/L   Glucose, Bld 142 (*) 70 - 99 mg/dL   BUN 32 (*) 6 - 23 mg/dL   Creatinine, Ser 1.61  0.50 - 1.35 mg/dL   Calcium 9.9  8.4 - 09.6 mg/dL   GFR calc non Af Amer 46 (*) >90 mL/min   GFR calc Af Amer 53 (*) >90 mL/min   Anion gap 14  5 - 15  CBC      Result Value Ref Range   WBC 9.2  4.0 - 10.5 K/uL   RBC 4.08 (*) 4.22 - 5.81 MIL/uL   Hemoglobin 14.0  13.0 - 17.0 g/dL   HCT 04.5  40.9 - 81.1 %   MCV 98.0  78.0 - 100.0 fL   MCH 34.3 (*) 26.0 - 34.0 pg   MCHC 35.0  30.0 - 36.0 g/dL   RDW 91.4  78.2 - 95.6 %   Platelets 204  150 - 400 K/uL  TROPONIN I      Result Value Ref Range   Troponin I <0.30  <0.30 ng/mL  TROPONIN I      Result Value Ref Range   Troponin I <0.30  <0.30 ng/mL   Laboratory interpretation all normal except persistent hyponatremia, low chloride which are very minimal, elevated BUN     Imaging Review Dg Chest Port 1 View  08/21/2014   CLINICAL DATA:  Chest pain.  Initial encounter.  EXAM: PORTABLE CHEST - 1 VIEW  COMPARISON:  03/11/2013  FINDINGS: No cardiomegaly  considering the technique. Chronic aortic tortuosity. Diffuse interstitial coarsening which is likely bronchitic. No effusion or pneumothorax. No focal pneumonia. No acute osseous findings.  IMPRESSION: Bronchitic changes.   Electronically Signed   By: Tiburcio Pea M.D.   On: 08/21/2014 22:34     EKG Interpretation   Date/Time:  Wednesday August 21 2014 21:56:20 EDT Ventricular Rate:  77 PR Interval:  219 QRS Duration: 159 QT Interval:  436 QTC Calculation: 493 R Axis:   65 Text Interpretation:  Sinus rhythm Borderline prolonged PR interval  Probable left atrial enlargement Right bundle branch block No significant  change since last tracing 11 Mar 2013 Confirmed by Lezette Kitts  MD-I, Kataryna Mcquilkin  ([id number]) on 08/21/2014 11:09:08 PM      MDM   Final diagnoses:  Chest pressure      Plan discharge  Devoria Albe, MD, FACEP   I personally performed the services described in this documentation, which was scribed in my presence. The recorded information has been reviewed and considered.  Devoria Albe, MD, Armando Gang    Ward Givens, MD 08/22/14 [phone]

## 2014-08-22 ENCOUNTER — Encounter: Payer: Self-pay | Admitting: Family Medicine

## 2014-08-22 DIAGNOSIS — R0789 Other chest pain: Secondary | ICD-10-CM | POA: Diagnosis not present

## 2014-08-22 LAB — TROPONIN I

## 2014-08-22 NOTE — ED Notes (Signed)
Patient and spouse verbalize understanding of discharge instructions, home care and follow up care. Patient ambulatory out of department at this time with spouse.

## 2014-08-22 NOTE — Discharge Instructions (Signed)
Return to the ED if you get the discomfort again and it is lasting more than 20 minutes. Have Dr Gerda DissLuking recheck you in the office soon.

## 2014-08-23 ENCOUNTER — Ambulatory Visit (INDEPENDENT_AMBULATORY_CARE_PROVIDER_SITE_OTHER): Payer: Medicare Other | Admitting: Family Medicine

## 2014-08-23 ENCOUNTER — Encounter: Payer: Self-pay | Admitting: Family Medicine

## 2014-08-23 VITALS — BP 102/74 | Ht 69.0 in | Wt 155.0 lb

## 2014-08-23 DIAGNOSIS — R079 Chest pain, unspecified: Secondary | ICD-10-CM

## 2014-08-23 NOTE — Progress Notes (Signed)
   Subjective:    Patient ID: Bradley Reed, male    DOB: 01/13/1924, 80 y.o.   MRN: [id number]  HPIFollow up from ED for chest pain. Patient he presents for followup from the emergency room. He is accompanied by 2 family members.  Patient had an episode of chest discomfort. This was accompanied by diffuse shakiness. Shakiness started first. Lasted a couple of hours. Occurred in the context of been around a tremendous number of relatives at a family get together at his house.  Patient reports this made him feel uncomfortable.  His shakiness was much more symptoms than the slight pressure in his chest. No associated nausea or diaphoresis.  Patient has early dementia. Having more difficulty being around others in large crowds.  No exertional chest discomfort.  Spouse reports patient's trying to get shaky and large crowds at times. Appears anxious.   Patient is not interested in any medication for anxiety  Entire hospital record reviewed in the presence of the patient and family.    Review of Systems No nausea no diaphoresis no shortness of breath no abdominal pain no change in bowel habits no blood in stool    Objective:   Physical Exam  Alert no acute distress. Vitals stable. HEENT normal. Lungs clear. Heart regular in rhythm. Abdomen benign.      Assessment & Plan:  Impression transient chest discomfort. In context to #2 patient had a very thorough workup in the emergency room for acute coronary syndrome. Has experienced no further pain at this time. #2 flare of anxiety with tremulousness. Plan Crowder avoidance in the future discussed. Hold off on further cardiac workup 35-40 minutes spent most in discussion. WSL

## 2014-09-05 ENCOUNTER — Other Ambulatory Visit: Payer: Self-pay | Admitting: Family Medicine

## 2014-09-16 ENCOUNTER — Other Ambulatory Visit: Payer: Self-pay | Admitting: Neurology

## 2014-09-17 ENCOUNTER — Ambulatory Visit: Payer: Medicare Other | Admitting: Nurse Practitioner

## 2014-11-20 ENCOUNTER — Ambulatory Visit: Payer: Medicare Other | Admitting: Family Medicine

## 2014-12-04 ENCOUNTER — Encounter: Payer: Self-pay | Admitting: Neurology

## 2014-12-04 ENCOUNTER — Ambulatory Visit (INDEPENDENT_AMBULATORY_CARE_PROVIDER_SITE_OTHER): Payer: Medicare Other | Admitting: Neurology

## 2014-12-04 VITALS — BP 124/60 | HR 67 | Temp 98.2°F | Resp 18 | Ht 69.0 in | Wt 154.4 lb

## 2014-12-04 DIAGNOSIS — F0280 Dementia in other diseases classified elsewhere without behavioral disturbance: Secondary | ICD-10-CM

## 2014-12-04 DIAGNOSIS — G309 Alzheimer's disease, unspecified: Secondary | ICD-10-CM

## 2014-12-04 DIAGNOSIS — G250 Essential tremor: Secondary | ICD-10-CM

## 2014-12-04 MED ORDER — RIVASTIGMINE TARTRATE 3 MG PO CAPS: 3.0000 mg | ORAL_CAPSULE | Freq: Two times a day (BID) | ORAL | Status: DC

## 2014-12-04 NOTE — Patient Instructions (Signed)
I am pleased you are doing well. Since you have skin irritation, we will stop the patch and switch to the pill form.  It is rivastigmine 3mg  capsules.  Take 1 capsule twice daily. Follow up in 6 months.

## 2014-12-04 NOTE — Progress Notes (Signed)
NEUROLOGY FOLLOW UP OFFICE NOTE  Bradley Reed [id number]  HISTORY OF PRESENT ILLNESS: Bradley Reed is an 81 year old left-handed man with history of diabetes, hypertension, hypercholesterolemia, glaucoma, macular degeneration, tremor and hearing loss who follows up for Alzheimer's disease and essential tremor.  He is accompanied by his family who provides history.  UPDATE: He was started on the Exelon patch 4.6mg  patch.  It has been effective and he has been doing well.  Mood is good.  He is sleeping well.  He reads often.  There has been no cognitive changes since last time.  However, the patch leaves a rash.  It is red with some raised papules.  There are no blisters but it itches.Marland Kitchen.  HISTORY: Symptoms started approximately 2 years ago. At that time, he began having memory problems. It has gradually progressed over the last couple of years. Sometimes he is a little disoriented when he is out, even in familiar areas. He doesn't remember directions sometimes even on familiar routes. His wife also has some memory problems. He and his wife no longer drive.  He has to write everything down on the calendar to help him remember his appointments. This method definitely works. He also uses a pillbox to help him remember to take his medications correctly, which he does. He actually has corrected the pharmacy regarding mistakes on his medications. Most of that his bills are paid via automatic payment system. However, his wife will pay any other bills. He does perform chores at home, such as mowing the lawn or splitting wood. He is afraid she has read or although he does have trouble remembering what he has read. He does plays sodoku. He has not experienced any changes in personality. He does get frustrated when he can't remember things. He denies depression. His appetite is good. He sleeps well. He also has had change in his gait. His wife says that he seems to shuffle more. He also has a tremor or in  his hands as well. He has not had any falls. There is no known family history of any neurological disorders.  He was previously on Aricept, but it was discontinued due to causing increased drowsiness and confusion.  PAST MEDICAL HISTORY: Past Medical History  Diagnosis Date  . Diabetes mellitus   . Hypertension   . Hypercholesteremia   . Glaucoma   . Diverticulosis   . Prostatism   . Reflux   . Tremor   . Other general symptoms(780.99)   . Other vitamin B12 deficiency anemia   . Essential and other specified forms of tremor   . Abnormality of gait   . Memory loss   . Hearing difficulty of both ears     Bilateral hearing aids  . Macular degeneration     MEDICATIONS: Current Outpatient Prescriptions on File Prior to Visit  Medication Sig Dispense Refill  . aspirin EC 81 MG tablet Take 81 mg by mouth daily.    . bimatoprost (LUMIGAN) 0.01 % SOLN Place 1 drop into both eyes at bedtime.     . Calcium Carbonate-Vitamin D (CALCIUM 600+D) 600-400 MG-UNIT per tablet Take 1 tablet by mouth 2 (two) times daily.      . fexofenadine (ALLEGRA) 180 MG tablet Take 180 mg by mouth 2 (two) times daily.     . Glucosamine HCl 1500 MG TABS Take 1,500 mg by mouth daily.    Marland Kitchen. lisinopril (PRINIVIL,ZESTRIL) 40 MG tablet Take 40 mg by mouth daily.    .Marland Kitchen  metFORMIN (GLUCOPHAGE) 500 MG tablet TAKE 1 TABLET BY MOUTH TWICE DAILY WITH MEALS. 60 tablet 5  . Multiple Vitamins-Minerals (VITEYES AREDS FORMULA PO) Take 1 tablet by mouth 2 (two) times daily.     . pravastatin (PRAVACHOL) 40 MG tablet Take 40 mg by mouth at bedtime.    . senna (SENOKOT) 8.6 MG TABS Take 1 tablet by mouth daily as needed (constipation).     Marland Kitchen SIMBRINZA 1-0.2 % SUSP Place 1 drop into both eyes 2 (two) times daily.    . vitamin B-12 (CYANOCOBALAMIN) 100 MCG tablet Take 100 mcg by mouth 2 (two) times daily.      . vitamin C (ASCORBIC ACID) 500 MG tablet Take 500 mg by mouth daily.     No current facility-administered medications on  file prior to visit.    ALLERGIES: Allergies  Allergen Reactions  . Ciprofloxacin   . Bactrim Other (See Comments)    Shaking, patient doesn't recall this allergy     FAMILY HISTORY: Family History  Problem Relation Age of Onset  . Stroke Mother   . Heart failure Father   . Heart disease Brother   . Arthritis Brother     SOCIAL HISTORY: History   Social History  . Marital Status: Married    Spouse Name: N/A    Number of Children: 3  . Years of Education: PHD   Occupational History  . Teacher     Retired   Social History Main Topics  . Smoking status: Former Smoker    Types: Cigarettes    Quit date: 03/17/1963  . Smokeless tobacco: Never Used  . Alcohol Use: No  . Drug Use: No  . Sexual Activity: Not Currently   Other Topics Concern  . Not on file   Social History Narrative   Patient is married and lives with his wife. Patient worked as a Runner, broadcasting/film/video for [employer].   Patient has his PHD degree.    REVIEW OF SYSTEMS: Constitutional: No fevers, chills, or sweats, no generalized fatigue, change in appetite Eyes: No visual changes, double vision, eye pain Ear, nose and throat: No hearing loss, ear pain, nasal congestion, sore throat Cardiovascular: No chest pain, palpitations Respiratory:  No shortness of breath at rest or with exertion, wheezes GastrointestinaI: No nausea, vomiting, diarrhea, abdominal pain, fecal incontinence Genitourinary:  No dysuria, urinary retention or frequency Musculoskeletal:  No neck pain, back pain Integumentary: No rash, pruritus, skin lesions Neurological: as above Psychiatric: No depression, insomnia, anxiety Endocrine: No palpitations, fatigue, diaphoresis, mood swings, change in appetite, change in weight, increased thirst Hematologic/Lymphatic:  No anemia, purpura, petechiae. Allergic/Immunologic: no itchy/runny eyes, nasal congestion, recent allergic reactions, rashes  PHYSICAL EXAM: Filed Vitals:    12/04/14 1341  BP: 124/60  Pulse: 67  Temp: 98.2 F (36.8 C)  Resp: 18   General: No acute distress Head:  Normocephalic/atraumatic Eyes:  Fundi not visualized on inspection.. Neck: supple, no paraspinal tenderness, full range of motion Heart:  Regular rate and rhythm Lungs:  Clear to auscultation bilaterally Back: No paraspinal tenderness Neurological Exam: alert and oriented to person, place, and time. Attention span and concentration fair, delayed recall poor, remote memory intact, fund of knowledge intact.  Speech fluent and not dysarthric, language intact.   Montreal Cognitive Assessment  12/04/2014 06/03/2014  Visuospatial/ Executive (0/5) 4 4  Naming (0/3) 3 2  Attention: Read list of digits (0/2) 1 2  Attention: Read list of letters (0/1) 0 1  Attention: Serial 7  subtraction starting at 100 (0/3) 1 3  Language: Repeat phrase (0/2) 0 0  Language : Fluency (0/1) 0 0  Abstraction (0/2) 2 1  Delayed Recall (0/5) 0 0  Orientation (0/6) 4 4  Total 15 17  Adjusted Score (based on education) 15 -   Dysphonic.  CN II-XII intact. Fundi not visualized.  Bulk and tone normal, muscle strength 5/5 throughout.  No rigidity.  Reduced finger thumb tapping speed and amplitude.  Sensation to light touch intact.  Mild postural and kinetic tremor noted bilaterally.  No resting tremor.  Deep tendon reflexes 1+ throughout.  Finger to nose testing without dysmetria.  Gait with slightly reduced arm swing bilaterally, spine leaning side ways to the left with slight shuffling.  Romberg with mild sway.  IMPRESSION: Alzheimer's disease. Memory predominant cognitive impairment. Essential tremor.  He does exhibit some bradykinesia but I am not convinced there is Parkinson's disease.  PLAN: 1.  Will stop the Exelon patch and switch to the capsule,  twice daily. 2.  Follow up in 6 months.  Shon Millet, DO  CC: Lilyan Punt, MD

## 2015-02-07 ENCOUNTER — Telehealth: Payer: Self-pay | Admitting: Family Medicine

## 2015-02-07 DIAGNOSIS — E119 Type 2 diabetes mellitus without complications: Secondary | ICD-10-CM

## 2015-02-07 DIAGNOSIS — Z79899 Other long term (current) drug therapy: Secondary | ICD-10-CM

## 2015-02-07 DIAGNOSIS — I10 Essential (primary) hypertension: Secondary | ICD-10-CM

## 2015-02-07 DIAGNOSIS — E7800 Pure hypercholesterolemia, unspecified: Secondary | ICD-10-CM

## 2015-02-07 NOTE — Telephone Encounter (Signed)
08/13/14:  Lip, liv, a1c,

## 2015-02-07 NOTE — Telephone Encounter (Signed)
Same plus m7 

## 2015-02-07 NOTE — Telephone Encounter (Signed)
Pt's wife notified and verbalized understanding to go to [facility] and be fasting. 

## 2015-02-07 NOTE — Telephone Encounter (Signed)
Calling to request order for BW for upcoming appointment. °

## 2015-02-15 ENCOUNTER — Other Ambulatory Visit: Payer: Self-pay | Admitting: Family Medicine

## 2015-02-15 LAB — LIPID PANEL
Chol/HDL Ratio: 2.8 ratio units (ref 0.0–5.0)
Cholesterol, Total: 169 mg/dL (ref 100–199)
HDL: 60 mg/dL (ref >39)
LDL Calculated: 89 mg/dL (ref 0–99)
Triglycerides: 102 mg/dL (ref 0–149)
VLDL Cholesterol Cal: 20 mg/dL (ref 5–40)

## 2015-02-15 LAB — HEPATIC FUNCTION PANEL
ALT: 6 IU/L (ref 0–44)
AST: 17 IU/L (ref 0–40)
Albumin: 4.6 g/dL (ref 3.5–4.7)
Alkaline Phosphatase: 62 IU/L (ref 39–117)
BILIRUBIN TOTAL: 0.7 mg/dL (ref 0.0–1.2)
Bilirubin, Direct: 0.2 mg/dL (ref 0.00–0.40)
TOTAL PROTEIN: 7.4 g/dL (ref 6.0–8.5)

## 2015-02-15 LAB — BASIC METABOLIC PANEL
BUN / CREAT RATIO: 18 (ref 10–22)
BUN: 25 mg/dL (ref 8–27)
CO2: 23 mmol/L (ref 18–29)
CREATININE: 1.37 mg/dL — AB (ref 0.76–1.27)
Calcium: 9.9 mg/dL (ref 8.6–10.2)
Chloride: 100 mmol/L (ref 97–108)
GFR calc Af Amer: 52 mL/min/{1.73_m2} — ABNORMAL LOW (ref >59)
GFR calc non Af Amer: 45 mL/min/{1.73_m2} — ABNORMAL LOW (ref >59)
Glucose: 130 mg/dL — ABNORMAL HIGH (ref 65–99)
POTASSIUM: 4.8 mmol/L (ref 3.5–5.2)
Sodium: 139 mmol/L (ref 134–144)

## 2015-02-15 LAB — HEMOGLOBIN A1C
ESTIMATED AVERAGE GLUCOSE: 146 mg/dL
Hgb A1c MFr Bld: 6.7 % — ABNORMAL HIGH (ref 4.8–5.6)

## 2015-02-19 ENCOUNTER — Other Ambulatory Visit: Payer: Self-pay | Admitting: Family Medicine

## 2015-02-25 ENCOUNTER — Ambulatory Visit (INDEPENDENT_AMBULATORY_CARE_PROVIDER_SITE_OTHER): Payer: Medicare Other | Admitting: Family Medicine

## 2015-02-25 ENCOUNTER — Encounter: Payer: Self-pay | Admitting: Family Medicine

## 2015-02-25 VITALS — BP 120/78 | Ht 69.0 in | Wt 152.2 lb

## 2015-02-25 DIAGNOSIS — E7800 Pure hypercholesterolemia, unspecified: Secondary | ICD-10-CM

## 2015-02-25 DIAGNOSIS — F03A0 Unspecified dementia, mild, without behavioral disturbance, psychotic disturbance, mood disturbance, and anxiety: Secondary | ICD-10-CM

## 2015-02-25 DIAGNOSIS — E119 Type 2 diabetes mellitus without complications: Secondary | ICD-10-CM

## 2015-02-25 DIAGNOSIS — F0390 Unspecified dementia without behavioral disturbance: Secondary | ICD-10-CM

## 2015-02-25 DIAGNOSIS — I10 Essential (primary) hypertension: Secondary | ICD-10-CM

## 2015-02-25 NOTE — Progress Notes (Signed)
   Subjective:    Patient ID: Bradley Reed, male    DOB: 07/20/1924, 81 y.o.   MRN: [id number]  Diabetes He presents for his follow-up diabetic visit. He has type 2 diabetes mellitus. His disease course has been stable. There are no hypoglycemic associated symptoms. There are no diabetic associated symptoms. There are no hypoglycemic complications. Symptoms are stable. There are no diabetic complications. There are no known risk factors for coronary artery disease. Current diabetic treatment includes oral agent (monotherapy). He is compliant with treatment all of the time.   Patient has been having dizzy spells for the past few weeks now.   Results for orders placed or performed in visit on 02/07/15  Lipid panel  Result Value Ref Range   Cholesterol, Total 169 100 - 199 mg/dL   Triglycerides 409102 0 - 149 mg/dL   HDL 60 >81>39 mg/dL   VLDL Cholesterol Cal 20 5 - 40 mg/dL   LDL Calculated 89 0 - 99 mg/dL   Chol/HDL Ratio 2.8 0.0 - 5.0 ratio units  Hepatic function panel  Result Value Ref Range   Total Protein 7.4 6.0 - 8.5 g/dL   Albumin 4.6 3.5 - 4.7 g/dL   Bilirubin Total 0.7 0.0 - 1.2 mg/dL   Bilirubin, Direct 1.910.20 0.00 - 0.40 mg/dL   Alkaline Phosphatase 62 39 - 117 IU/L   AST 17 0 - 40 IU/L   ALT 6 0 - 44 IU/L  Basic metabolic panel  Result Value Ref Range   Glucose 130 (H) 65 - 99 mg/dL   BUN 25 8 - 27 mg/dL   Creatinine, Ser 4.781.37 (H) 0.76 - 1.27 mg/dL   GFR calc non Af Amer 45 (L) >59 mL/min/1.73   GFR calc Af Amer 52 (L) >59 mL/min/1.73   BUN/Creatinine Ratio 18 10 - 22   Sodium 139 134 - 144 mmol/L   Potassium 4.8 3.5 - 5.2 mmol/L   Chloride 100 97 - 108 mmol/L   CO2 23 18 - 29 mmol/L   Calcium 9.9 8.6 - 10.2 mg/dL  Hemoglobin G9FA1c  Result Value Ref Range   Hgb A1c MFr Bld 6.7 (H) 4.8 - 5.6 %   Est. average glucose Bld gHb Est-mCnc 146 mg/dL  vertiginous dizziness, off-and-on, generally with change of positions.  Patient was on medication for his early dementia. He  felt this was causing him dizziness. He stopped the medication and notes significant improvement. Family reports no decline in mental functioning.  Patient has known history of hyperlipidemia. Trying to watch fats in diet. Still exercising a fair amount.  On medication for blood pressure past. Has stopped it. Blood pressure still remained good.  Patient has had blood work done also. A1C was included.  Review of Systems No headache no chest pain no back pain no abdominal pain no change in bowel habits no blood in stool ROS otherwise negative    Objective:   Physical Exam  Alert vital stable blood pressure good on repeat HEENT normal. Lungs clear. Heart regular in rhythm. Slight tremor noted.      Assessment & Plan:  Impression 1 hypertension now good off medications #2 type 2 diabetes controlled good discuss no episodes of hypoglycemia #3 dementia stable off medications #4 dizziness now resolved with having stopped the Exelon and lisinopril plan diet exercise discussed maintain same medications. Recheck every 6 months. WSL

## 2015-05-01 ENCOUNTER — Other Ambulatory Visit: Payer: Self-pay | Admitting: Family Medicine

## 2015-06-04 ENCOUNTER — Ambulatory Visit: Payer: Medicare Other | Admitting: Neurology

## 2015-06-15 ENCOUNTER — Inpatient Hospital Stay (HOSPITAL_COMMUNITY)
Admission: EM | Admit: 2015-06-15 | Discharge: 2015-06-20 | DRG: 195 | Disposition: A | Discharge status: Home Health | Payer: Medicare Other | Attending: Family Medicine | Admitting: Family Medicine

## 2015-06-15 ENCOUNTER — Emergency Department (HOSPITAL_COMMUNITY): Payer: Medicare Other

## 2015-06-15 ENCOUNTER — Encounter (HOSPITAL_COMMUNITY): Payer: Self-pay | Admitting: Emergency Medicine

## 2015-06-15 DIAGNOSIS — G309 Alzheimer's disease, unspecified: Secondary | ICD-10-CM | POA: Diagnosis present

## 2015-06-15 DIAGNOSIS — R269 Unspecified abnormalities of gait and mobility: Secondary | ICD-10-CM | POA: Diagnosis present

## 2015-06-15 DIAGNOSIS — J189 Pneumonia, unspecified organism: Secondary | ICD-10-CM | POA: Diagnosis not present

## 2015-06-15 DIAGNOSIS — F0280 Dementia in other diseases classified elsewhere without behavioral disturbance: Secondary | ICD-10-CM | POA: Diagnosis present

## 2015-06-15 DIAGNOSIS — H409 Unspecified glaucoma: Secondary | ICD-10-CM | POA: Diagnosis present

## 2015-06-15 DIAGNOSIS — H3530 Unspecified macular degeneration: Secondary | ICD-10-CM | POA: Diagnosis present

## 2015-06-15 DIAGNOSIS — E785 Hyperlipidemia, unspecified: Secondary | ICD-10-CM | POA: Diagnosis present

## 2015-06-15 DIAGNOSIS — E780 Pure hypercholesterolemia: Secondary | ICD-10-CM | POA: Diagnosis present

## 2015-06-15 DIAGNOSIS — R251 Tremor, unspecified: Secondary | ICD-10-CM | POA: Diagnosis present

## 2015-06-15 DIAGNOSIS — Z79899 Other long term (current) drug therapy: Secondary | ICD-10-CM

## 2015-06-15 DIAGNOSIS — I10 Essential (primary) hypertension: Secondary | ICD-10-CM | POA: Diagnosis present

## 2015-06-15 DIAGNOSIS — D519 Vitamin B12 deficiency anemia, unspecified: Secondary | ICD-10-CM | POA: Diagnosis present

## 2015-06-15 DIAGNOSIS — H9190 Unspecified hearing loss, unspecified ear: Secondary | ICD-10-CM | POA: Diagnosis present

## 2015-06-15 DIAGNOSIS — K219 Gastro-esophageal reflux disease without esophagitis: Secondary | ICD-10-CM | POA: Diagnosis present

## 2015-06-15 DIAGNOSIS — R1310 Dysphagia, unspecified: Secondary | ICD-10-CM

## 2015-06-15 DIAGNOSIS — Z7982 Long term (current) use of aspirin: Secondary | ICD-10-CM

## 2015-06-15 DIAGNOSIS — K5790 Diverticulosis of intestine, part unspecified, without perforation or abscess without bleeding: Secondary | ICD-10-CM | POA: Diagnosis present

## 2015-06-15 DIAGNOSIS — Z66 Do not resuscitate: Secondary | ICD-10-CM | POA: Diagnosis present

## 2015-06-15 DIAGNOSIS — R410 Disorientation, unspecified: Secondary | ICD-10-CM

## 2015-06-15 DIAGNOSIS — Z881 Allergy status to other antibiotic agents status: Secondary | ICD-10-CM

## 2015-06-15 DIAGNOSIS — Z87891 Personal history of nicotine dependence: Secondary | ICD-10-CM

## 2015-06-15 DIAGNOSIS — E119 Type 2 diabetes mellitus without complications: Secondary | ICD-10-CM | POA: Diagnosis present

## 2015-06-15 DIAGNOSIS — N400 Enlarged prostate without lower urinary tract symptoms: Secondary | ICD-10-CM | POA: Diagnosis present

## 2015-06-15 LAB — COMPREHENSIVE METABOLIC PANEL
ALT: 13 U/L — ABNORMAL LOW (ref 17–63)
ANION GAP: 10 (ref 5–15)
AST: 19 U/L (ref 15–41)
Albumin: 3.8 g/dL (ref 3.5–5.0)
Alkaline Phosphatase: 72 U/L (ref 38–126)
BUN: 20 mg/dL (ref 6–20)
CHLORIDE: 101 mmol/L (ref 101–111)
CO2: 23 mmol/L (ref 22–32)
Calcium: 9.3 mg/dL (ref 8.9–10.3)
Creatinine, Ser: 1.21 mg/dL (ref 0.61–1.24)
GFR calc non Af Amer: 51 mL/min — ABNORMAL LOW (ref >60)
GFR, EST AFRICAN AMERICAN: 59 mL/min — AB (ref >60)
Glucose, Bld: 162 mg/dL — ABNORMAL HIGH (ref 65–99)
POTASSIUM: 3.7 mmol/L (ref 3.5–5.1)
SODIUM: 134 mmol/L — AB (ref 135–145)
TOTAL PROTEIN: 7.6 g/dL (ref 6.5–8.1)
Total Bilirubin: 0.2 mg/dL — ABNORMAL LOW (ref 0.3–1.2)

## 2015-06-15 LAB — CBC WITH DIFFERENTIAL/PLATELET
Basophils Absolute: 0.1 10*3/uL (ref 0.0–0.1)
Basophils Relative: 1 % (ref 0–1)
Eosinophils Absolute: 0.1 10*3/uL (ref 0.0–0.7)
Eosinophils Relative: 1 % (ref 0–5)
HEMATOCRIT: 39.5 % (ref 39.0–52.0)
Hemoglobin: 13.6 g/dL (ref 13.0–17.0)
Lymphocytes Relative: 13 % (ref 12–46)
Lymphs Abs: 1.1 10*3/uL (ref 0.7–4.0)
MCH: 33.3 pg (ref 26.0–34.0)
MCHC: 34.4 g/dL (ref 30.0–36.0)
MCV: 96.8 fL (ref 78.0–100.0)
Monocytes Absolute: 1.4 10*3/uL — ABNORMAL HIGH (ref 0.1–1.0)
Monocytes Relative: 17 % — ABNORMAL HIGH (ref 3–12)
NEUTROS ABS: 5.6 10*3/uL (ref 1.7–7.7)
Neutrophils Relative %: 68 % (ref 43–77)
PLATELETS: 195 10*3/uL (ref 150–400)
RBC: 4.08 MIL/uL — AB (ref 4.22–5.81)
RDW: 13.2 % (ref 11.5–15.5)
WBC: 8.2 10*3/uL (ref 4.0–10.5)

## 2015-06-15 LAB — TROPONIN I

## 2015-06-15 LAB — LACTIC ACID, PLASMA: LACTIC ACID, VENOUS: 1.1 mmol/L (ref 0.5–2.0)

## 2015-06-15 MED ORDER — DEXTROSE 5 % IV SOLN
1.0000 g | Freq: Once | INTRAVENOUS | Status: AC
  Administered 2015-06-15: 1 g via INTRAVENOUS
  Filled 2015-06-15: qty 10

## 2015-06-15 MED ORDER — AZITHROMYCIN 250 MG PO TABS
500.0000 mg | ORAL_TABLET | Freq: Once | ORAL | Status: AC
Start: 2015-06-15 — End: 2015-06-15
  Administered 2015-06-15: 500 mg via ORAL
  Filled 2015-06-15: qty 2

## 2015-06-15 MED ORDER — ACETAMINOPHEN 325 MG PO TABS
650.0000 mg | ORAL_TABLET | Freq: Once | ORAL | Status: AC
  Administered 2015-06-15: 650 mg via ORAL
  Filled 2015-06-15: qty 2

## 2015-06-15 NOTE — ED Provider Notes (Signed)
CSN: [id number]     Arrival date & time 06/15/15  2053 History   First MD Initiated Contact with Patient 06/15/15 2323     Chief Complaint  Patient presents with  . Fever  . Fatigue     Patient is a 81 y.o. male presenting with fever. The history is provided by the patient and the spouse.  Fever Max temp prior to arrival:  104 Severity:  Severe Onset quality:  Gradual Duration:  1 day Timing:  Constant Progression:  Worsening Chronicity:  New Relieved by:  Nothing Worsened by:  Nothing tried Associated symptoms: chills and cough   Associated symptoms: no diarrhea and no vomiting   Patient presents from home for cough/cold for past 2-3 days Over past day patient has spiked fever as well as increased fatigue and difficulty walking No vomiting/diarrhea No hemoptysis   Past Medical History  Diagnosis Date  . Diabetes mellitus   . Hypertension   . Hypercholesteremia   . Glaucoma   . Diverticulosis   . Prostatism   . Reflux   . Tremor   . Other general symptoms(780.99)   . Other vitamin B12 deficiency anemia   . Essential and other specified forms of tremor   . Abnormality of gait   . Memory loss   . Hearing difficulty of both ears     Bilateral hearing aids  . Macular degeneration    Past Surgical History  Procedure Laterality Date  . Leg surgery    . Tonsillectomy    . Eye surgery     Family History  Problem Relation Age of Onset  . Stroke Mother   . Heart failure Father   . Heart disease Brother   . Arthritis Brother    History  Substance Use Topics  . Smoking status: Former Smoker    Types: Cigarettes    Quit date: 03/17/1963  . Smokeless tobacco: Never Used  . Alcohol Use: No    Review of Systems  Constitutional: Positive for fever, chills and fatigue.  Respiratory: Positive for cough.   Gastrointestinal: Negative for vomiting and diarrhea.  Neurological: Negative for syncope.  All other systems reviewed and are negative.     Allergies   Ciprofloxacin and Bactrim  Home Medications   Prior to Admission medications   Medication Sig Start Date End Date Taking? Authorizing Provider  aspirin EC 81 MG tablet Take 81 mg by mouth daily.   Yes Historical Provider, MD  Calcium Carbonate-Vitamin D (CALCIUM 600+D) 600-400 MG-UNIT per tablet Take 1 tablet by mouth 2 (two) times daily.     Yes Historical Provider, MD  fexofenadine (ALLEGRA) 180 MG tablet Take 180 mg by mouth daily.    Yes Historical Provider, MD  Glucosamine HCl 1500 MG TABS Take 1,500 mg by mouth daily.   Yes Historical Provider, MD  lisinopril (PRINIVIL,ZESTRIL) 40 MG tablet Take 40 mg by mouth every evening.   Yes Historical Provider, MD  metFORMIN (GLUCOPHAGE) 500 MG tablet TAKE 1 TABLET BY MOUTH TWICE DAILY WITH MEALS. 05/01/15  Yes Babs Sciara, MD  Multiple Vitamins-Minerals (VITEYES AREDS FORMULA PO) Take 1 tablet by mouth 2 (two) times daily.    Yes Historical Provider, MD  pravastatin (PRAVACHOL) 40 MG tablet Take 40 mg by mouth every evening.   Yes Historical Provider, MD  rivastigmine (EXELON) 4.6 mg/24hr Place 4.6 mg onto the skin daily.   Yes Historical Provider, MD  senna (SENOKOT) 8.6 MG TABS Take 1 tablet by mouth daily as  needed (constipation).    Yes Historical Provider, MD  SIMBRINZA 1-0.2 % SUSP Place 1 drop into both eyes 2 (two) times daily. 08/21/14  Yes Historical Provider, MD  vitamin B-12 (CYANOCOBALAMIN) 100 MCG tablet Take 100 mcg by mouth 2 (two) times daily.     Yes Historical Provider, MD  vitamin C (ASCORBIC ACID) 500 MG tablet Take 500 mg by mouth daily.   Yes Historical Provider, MD  pravastatin (PRAVACHOL) 80 MG tablet TAKE ONE TABLET BY MOUTH ONCE DAILY. Patient not taking: Reported on 06/15/2015 05/01/15   Babs Sciara, MD   BP 177/102 mmHg  Pulse 97  Temp(Src) 100.5 F (38.1 C) (Rectal)  Ht 5\' 9"  (1.753 m)  Wt 152 lb (68.947 kg)  BMI 22.44 kg/m2  SpO2 99% Physical Exam CONSTITUTIONAL: elderly, frail HEAD:  Normocephalic/atraumatic EYES: EOMI/PERRL ENMT: Mucous membranes moist NECK: supple no meningeal signs SPINE/BACK:entire spine nontender CV: S1/S2 noted, no murmurs/rubs/gallops noted LUNGS: decreased breath sounds noted in bases, no apparent distress ABDOMEN: soft, nontender, no rebound or guarding, bowel sounds noted throughout abdomen GU:no cva tenderness NEURO: Pt is awake/alert/appropriate, moves all extremitiesx4.  No facial droop.   EXTREMITIES: pulses normal/equal, full ROM SKIN: warm, color normal PSYCH: no abnormalities of mood noted, alert and oriented to situation  ED Course  Procedures  11:52 PM Pt here with cough/congestion/fever He is noted to have pneumonia Will admit  for IV antibiotics Pt agreeable with plan He is not septic appearing at this time 12:02 AM D/w dr Onalee Hua Will admit to hospital Pt stable in the ED Labs Review Labs Reviewed  COMPREHENSIVE METABOLIC PANEL - Abnormal; Notable for the following:    Sodium 134 (*)    Glucose, Bld 162 (*)    ALT 13 (*)    Total Bilirubin 0.2 (*)    GFR calc non Af Amer 51 (*)    GFR calc Af Amer 59 (*)    All other components within normal limits  CBC WITH DIFFERENTIAL/PLATELET - Abnormal; Notable for the following:    RBC 4.08 (*)    Monocytes Relative 17 (*)    Monocytes Absolute 1.4 (*)    All other components within normal limits  URINE CULTURE  TROPONIN I  LACTIC ACID, PLASMA  LACTIC ACID, PLASMA  URINALYSIS, ROUTINE W REFLEX MICROSCOPIC (NOT AT [facility])    Imaging Review Dg Chest 2 View  06/15/2015   CLINICAL DATA:  Acute onset of fever and cough. Generalized weakness and fatigue. Initial encounter.  EXAM: CHEST  2 VIEW  COMPARISON:  Chest radiograph performed 08/21/2014  FINDINGS: The lungs are well-aerated. Bibasilar airspace opacities may reflect mild interstitial edema. Vascular congestion is noted. There is no evidence of pleural effusion or pneumothorax.  The heart is normal in size; the mediastinal  contour is within normal limits. No acute osseous abnormalities are seen.  IMPRESSION: Bibasilar airspace opacities may reflect mild interstitial edema. Vascular congestion noted. Pneumonia might have a similar appearance.   Electronically Signed   By: Roanna Raider M.D.   On: 06/15/2015 22:10     EKG Interpretation   Date/Time:  Sunday June 15 2015 22:13:27 EDT Ventricular Rate:  97 PR Interval:  216 QRS Duration: 154 QT Interval:  393 QTC Calculation: 499 R Axis:   46 Text Interpretation:  Sinus rhythm Prolonged PR interval Probable left  atrial enlargement Right bundle branch block No significant change since  last tracing Confirmed by Bebe Shaggy  MD, Achsah Mcquade ([id number]) on 06/15/2015  11:28:22 PM  Medications  cefTRIAXone (ROCEPHIN) 1 g in dextrose 5 % 50 mL IVPB (1 g Intravenous New Bag/Given 06/15/15 2353)  acetaminophen (TYLENOL) tablet 650 mg (650 mg Oral Given 06/15/15 2344)  azithromycin (ZITHROMAX) tablet 500 mg (500 mg Oral Given 06/15/15 2344)    MDM   Final diagnoses:  CAP (community acquired pneumonia)    Nursing notes including past medical history and social history reviewed and considered in documentation xrays/imaging reviewed by myself and considered during evaluation Labs/vital reviewed myself and considered during evaluation     Zadie Rhine, MD 06/16/15 0002

## 2015-06-15 NOTE — ED Notes (Signed)
Patient unable to stand for orthostatic 

## 2015-06-15 NOTE — ED Notes (Signed)
Patient's wife reports cold-like symptoms for 3 days. States patient started running a fever today. Also reports patient has generalized weakness and fatigue.

## 2015-06-16 ENCOUNTER — Inpatient Hospital Stay (HOSPITAL_COMMUNITY): Payer: Medicare Other

## 2015-06-16 DIAGNOSIS — Z87891 Personal history of nicotine dependence: Secondary | ICD-10-CM | POA: Diagnosis not present

## 2015-06-16 DIAGNOSIS — Z79899 Other long term (current) drug therapy: Secondary | ICD-10-CM | POA: Diagnosis not present

## 2015-06-16 DIAGNOSIS — E785 Hyperlipidemia, unspecified: Secondary | ICD-10-CM | POA: Diagnosis present

## 2015-06-16 DIAGNOSIS — Z7982 Long term (current) use of aspirin: Secondary | ICD-10-CM | POA: Diagnosis not present

## 2015-06-16 DIAGNOSIS — R251 Tremor, unspecified: Secondary | ICD-10-CM | POA: Diagnosis present

## 2015-06-16 DIAGNOSIS — E119 Type 2 diabetes mellitus without complications: Secondary | ICD-10-CM | POA: Diagnosis present

## 2015-06-16 DIAGNOSIS — J189 Pneumonia, unspecified organism: Principal | ICD-10-CM

## 2015-06-16 DIAGNOSIS — R269 Unspecified abnormalities of gait and mobility: Secondary | ICD-10-CM | POA: Diagnosis present

## 2015-06-16 DIAGNOSIS — F05 Delirium due to known physiological condition: Secondary | ICD-10-CM | POA: Diagnosis not present

## 2015-06-16 DIAGNOSIS — K5790 Diverticulosis of intestine, part unspecified, without perforation or abscess without bleeding: Secondary | ICD-10-CM | POA: Diagnosis present

## 2015-06-16 DIAGNOSIS — G309 Alzheimer's disease, unspecified: Secondary | ICD-10-CM

## 2015-06-16 DIAGNOSIS — F0280 Dementia in other diseases classified elsewhere without behavioral disturbance: Secondary | ICD-10-CM | POA: Diagnosis present

## 2015-06-16 DIAGNOSIS — Z881 Allergy status to other antibiotic agents status: Secondary | ICD-10-CM | POA: Diagnosis not present

## 2015-06-16 DIAGNOSIS — N400 Enlarged prostate without lower urinary tract symptoms: Secondary | ICD-10-CM | POA: Diagnosis present

## 2015-06-16 DIAGNOSIS — I10 Essential (primary) hypertension: Secondary | ICD-10-CM

## 2015-06-16 DIAGNOSIS — D519 Vitamin B12 deficiency anemia, unspecified: Secondary | ICD-10-CM | POA: Diagnosis present

## 2015-06-16 DIAGNOSIS — E780 Pure hypercholesterolemia: Secondary | ICD-10-CM | POA: Diagnosis present

## 2015-06-16 DIAGNOSIS — H3530 Unspecified macular degeneration: Secondary | ICD-10-CM | POA: Diagnosis present

## 2015-06-16 DIAGNOSIS — K219 Gastro-esophageal reflux disease without esophagitis: Secondary | ICD-10-CM | POA: Diagnosis present

## 2015-06-16 DIAGNOSIS — H409 Unspecified glaucoma: Secondary | ICD-10-CM | POA: Diagnosis present

## 2015-06-16 DIAGNOSIS — H9190 Unspecified hearing loss, unspecified ear: Secondary | ICD-10-CM | POA: Diagnosis present

## 2015-06-16 DIAGNOSIS — Z66 Do not resuscitate: Secondary | ICD-10-CM | POA: Diagnosis present

## 2015-06-16 LAB — URINALYSIS, ROUTINE W REFLEX MICROSCOPIC
Bilirubin Urine: NEGATIVE
GLUCOSE, UA: NEGATIVE mg/dL
Ketones, ur: NEGATIVE mg/dL
Leukocytes, UA: NEGATIVE
Nitrite: NEGATIVE
PH: 6.5 (ref 5.0–8.0)
Specific Gravity, Urine: 1.015 (ref 1.005–1.030)
Urobilinogen, UA: 0.2 mg/dL (ref 0.0–1.0)

## 2015-06-16 LAB — BASIC METABOLIC PANEL
Anion gap: 9 (ref 5–15)
BUN: 19 mg/dL (ref 6–20)
CO2: 24 mmol/L (ref 22–32)
Calcium: 9.4 mg/dL (ref 8.9–10.3)
Chloride: 103 mmol/L (ref 101–111)
Creatinine, Ser: 1.17 mg/dL (ref 0.61–1.24)
GFR calc non Af Amer: 53 mL/min — ABNORMAL LOW (ref >60)
Glucose, Bld: 144 mg/dL — ABNORMAL HIGH (ref 65–99)
POTASSIUM: 3.5 mmol/L (ref 3.5–5.1)
Sodium: 136 mmol/L (ref 135–145)

## 2015-06-16 LAB — GLUCOSE, CAPILLARY
GLUCOSE-CAPILLARY: 168 mg/dL — AB (ref 65–99)
Glucose-Capillary: 156 mg/dL — ABNORMAL HIGH (ref 65–99)
Glucose-Capillary: 152 mg/dL — ABNORMAL HIGH (ref 65–99)
Glucose-Capillary: 185 mg/dL — ABNORMAL HIGH (ref 65–99)

## 2015-06-16 LAB — CBC WITH DIFFERENTIAL/PLATELET
Basophils Absolute: 0.1 10*3/uL (ref 0.0–0.1)
Basophils Relative: 1 % (ref 0–1)
EOS PCT: 1 % (ref 0–5)
Eosinophils Absolute: 0.1 10*3/uL (ref 0.0–0.7)
HEMATOCRIT: 41.1 % (ref 39.0–52.0)
Hemoglobin: 14.2 g/dL (ref 13.0–17.0)
LYMPHS ABS: 1.5 10*3/uL (ref 0.7–4.0)
LYMPHS PCT: 17 % (ref 12–46)
MCH: 33.6 pg (ref 26.0–34.0)
MCHC: 34.5 g/dL (ref 30.0–36.0)
MCV: 97.2 fL (ref 78.0–100.0)
Monocytes Absolute: 1.4 10*3/uL — ABNORMAL HIGH (ref 0.1–1.0)
Monocytes Relative: 16 % — ABNORMAL HIGH (ref 3–12)
NEUTROS ABS: 5.6 10*3/uL (ref 1.7–7.7)
NEUTROS PCT: 65 % (ref 43–77)
Platelets: 193 10*3/uL (ref 150–400)
RBC: 4.23 MIL/uL (ref 4.22–5.81)
RDW: 13.3 % (ref 11.5–15.5)
WBC: 8.7 10*3/uL (ref 4.0–10.5)

## 2015-06-16 LAB — STREP PNEUMONIAE URINARY ANTIGEN: STREP PNEUMO URINARY ANTIGEN: NEGATIVE

## 2015-06-16 LAB — LACTIC ACID, PLASMA: LACTIC ACID, VENOUS: 0.9 mmol/L (ref 0.5–2.0)

## 2015-06-16 LAB — URINE MICROSCOPIC-ADD ON

## 2015-06-16 LAB — VITAMIN B12: Vitamin B-12: 705 pg/mL (ref 180–914)

## 2015-06-16 LAB — TSH: TSH: 0.803 u[IU]/mL (ref 0.350–4.500)

## 2015-06-16 MED ORDER — DEXTROSE 5 % IV SOLN
1.0000 g | INTRAVENOUS | Status: DC
  Administered 2015-06-16 – 2015-06-19 (×4): 1 g via INTRAVENOUS
  Filled 2015-06-16 (×6): qty 10

## 2015-06-16 MED ORDER — DEXTROSE 5 % IV SOLN
500.0000 mg | INTRAVENOUS | Status: DC
  Administered 2015-06-16 – 2015-06-19 (×4): 500 mg via INTRAVENOUS
  Filled 2015-06-16 (×6): qty 500

## 2015-06-16 MED ORDER — SODIUM CHLORIDE 0.9 % IV SOLN
INTRAVENOUS | Status: DC
  Administered 2015-06-16: 75 mL/h via INTRAVENOUS
  Administered 2015-06-16 – 2015-06-18 (×4): via INTRAVENOUS
  Administered 2015-06-19: 1000 mL via INTRAVENOUS

## 2015-06-16 MED ORDER — GADOBENATE DIMEGLUMINE 529 MG/ML IV SOLN
14.0000 mL | Freq: Once | INTRAVENOUS | Status: AC | PRN
  Administered 2015-06-16: 14 mL via INTRAVENOUS

## 2015-06-16 MED ORDER — ENOXAPARIN SODIUM 40 MG/0.4ML ~~LOC~~ SOLN
40.0000 mg | SUBCUTANEOUS | Status: DC
  Administered 2015-06-16 – 2015-06-19 (×5): 40 mg via SUBCUTANEOUS
  Filled 2015-06-16 (×5): qty 0.4

## 2015-06-16 MED ORDER — LORAZEPAM 2 MG/ML IJ SOLN
0.5000 mg | Freq: Once | INTRAMUSCULAR | Status: AC
  Administered 2015-06-16: 0.5 mg via INTRAVENOUS
  Filled 2015-06-16: qty 1

## 2015-06-16 MED ORDER — DEXTROSE 5 % IV SOLN
INTRAVENOUS | Status: AC
  Filled 2015-06-16: qty 500

## 2015-06-16 MED ORDER — DEXTROSE 5 % IV SOLN
INTRAVENOUS | Status: AC
  Filled 2015-06-16: qty 10

## 2015-06-16 NOTE — ED Notes (Signed)
Attempted to call report. Was advised nurse receiving pt will call this nurse back for report. 

## 2015-06-16 NOTE — H&P (Signed)
PCP:   Lubertha South, MD   Chief Complaint:  Cough, fever  HPI: 81 yo male lives alone very independent comes in with several days of cough, fever up to 104 not feeling well.  Lying in bed way more than normal, with chills.  No n/v/d.  No dysuria.  Is sob and with nonproductive cough.  Vitals stable, pt referred for admission for CAP mainly b/c of his advanced age, and living alone.    Review of Systems:  Positive and negative as per HPI otherwise all other systems are negative  Past Medical History: Past Medical History  Diagnosis Date  . Diabetes mellitus   . Hypertension   . Hypercholesteremia   . Glaucoma   . Diverticulosis   . Prostatism   . Reflux   . Tremor   . Other general symptoms(780.99)   . Other vitamin B12 deficiency anemia   . Essential and other specified forms of tremor   . Abnormality of gait   . Memory loss   . Hearing difficulty of both ears     Bilateral hearing aids  . Macular degeneration    Past Surgical History  Procedure Laterality Date  . Leg surgery    . Tonsillectomy    . Eye surgery      Medications: Prior to Admission medications   Medication Sig Start Date End Date Taking? Authorizing Provider  aspirin EC 81 MG tablet Take 81 mg by mouth daily.   Yes Historical Provider, MD  Calcium Carbonate-Vitamin D (CALCIUM 600+D) 600-400 MG-UNIT per tablet Take 1 tablet by mouth 2 (two) times daily.     Yes Historical Provider, MD  fexofenadine (ALLEGRA) 180 MG tablet Take 180 mg by mouth daily.    Yes Historical Provider, MD  Glucosamine HCl 1500 MG TABS Take 1,500 mg by mouth daily.   Yes Historical Provider, MD  lisinopril (PRINIVIL,ZESTRIL) 40 MG tablet Take 40 mg by mouth every evening.   Yes Historical Provider, MD  metFORMIN (GLUCOPHAGE) 500 MG tablet TAKE 1 TABLET BY MOUTH TWICE DAILY WITH MEALS. 05/01/15  Yes Babs Sciara, MD  Multiple Vitamins-Minerals (VITEYES AREDS FORMULA PO) Take 1 tablet by mouth 2 (two) times daily.    Yes  Historical Provider, MD  pravastatin (PRAVACHOL) 40 MG tablet Take 40 mg by mouth every evening.   Yes Historical Provider, MD  rivastigmine (EXELON) 4.6 mg/24hr Place 4.6 mg onto the skin daily.   Yes Historical Provider, MD  senna (SENOKOT) 8.6 MG TABS Take 1 tablet by mouth daily as needed (constipation).    Yes Historical Provider, MD  SIMBRINZA 1-0.2 % SUSP Place 1 drop into both eyes 2 (two) times daily. 08/21/14  Yes Historical Provider, MD  vitamin B-12 (CYANOCOBALAMIN) 100 MCG tablet Take 100 mcg by mouth 2 (two) times daily.     Yes Historical Provider, MD  vitamin C (ASCORBIC ACID) 500 MG tablet Take 500 mg by mouth daily.   Yes Historical Provider, MD  pravastatin (PRAVACHOL) 80 MG tablet TAKE ONE TABLET BY MOUTH ONCE DAILY. Patient not taking: Reported on 06/15/2015 05/01/15   Babs Sciara, MD    Allergies:   Allergies  Allergen Reactions  . Ciprofloxacin   . Bactrim Other (See Comments)    Shaking, patient doesn't recall this allergy     Social History:  reports that he quit smoking about 52 years ago. His smoking use included Cigarettes. He has never used smokeless tobacco. He reports that he does not drink alcohol or  use illicit drugs.  Family History: Family History  Problem Relation Age of Onset  . Stroke Mother   . Heart failure Father   . Heart disease Brother   . Arthritis Brother     Physical Exam: Filed Vitals:   06/15/15 2300 06/15/15 2330 06/16/15 0000 06/16/15 0043  BP: 160/100 161/96 169/112   Pulse: 93 90 94   Temp:    98.1 F (36.7 C)  TempSrc:    Oral  Resp: Height:      Weight:      SpO2: 94% 93% 95%    General appearance: alert, cooperative and no distress Head: Normocephalic, without obvious abnormality, atraumatic Eyes: negative Nose: Nares normal. Septum midline. Mucosa normal. No drainage or sinus tenderness. Neck: no JVD and supple, symmetrical, trachea midline Lungs: clear to auscultation bilaterally Heart: regular  rate and rhythm, S1, S2 normal, no murmur, click, rub or gallop Abdomen: soft, non-tender; bowel sounds normal; no masses,  no organomegaly Extremities: extremities normal, atraumatic, no cyanosis or edema Pulses: 2+ and symmetric Skin: Skin color, texture, turgor normal. No rashes or lesions Neuro -  Normal grossly, CN all normal   Labs on Admission:   Recent Labs  06/15/15 2222  NA 134*  K 3.7  CL 101  CO2 23  GLUCOSE 162*  BUN 20  CREATININE 1.21  CALCIUM 9.3    Recent Labs  06/15/15 2222  AST 19  ALT 13*  ALKPHOS 72  BILITOT 0.2*  PROT 7.6  ALBUMIN 3.8    Recent Labs  06/15/15 2222  WBC 8.2  NEUTROABS 5.6  HGB 13.6  HCT 39.5  MCV 96.8  PLT 195    Recent Labs  06/15/15 2222  TROPONINI <0.03    Radiological Exams on Admission: Dg Chest 2 View  06/15/2015   CLINICAL DATA:  Acute onset of fever and cough. Generalized weakness and fatigue. Initial encounter.  EXAM: CHEST  2 VIEW  COMPARISON:  Chest radiograph performed 08/21/2014  FINDINGS: The lungs are well-aerated. Bibasilar airspace opacities may reflect mild interstitial edema. Vascular congestion is noted. There is no evidence of pleural effusion or pneumothorax.  The heart is normal in size; the mediastinal contour is within normal limits. No acute osseous abnormalities are seen.  IMPRESSION: Bibasilar airspace opacities may reflect mild interstitial edema. Vascular congestion noted. Pneumonia might have a similar appearance.   Electronically Signed   By: Roanna Raider M.D.   On: 06/15/2015 22:10   cxr reviewed and no focal infiltrate or edema  Assessment/Plan  81 yo male with CAP  Principal Problem:   CAP (community acquired pneumonia)-  Pt appears amazingly well, place on iv rocephin and azithro.  ivf overnight.  apap for temp was 104 in ED.  Lactic acid nml.  Should improve with abx, obs on medical possible d/c tomorrow (tuesday).  Active Problems:   HTN (hypertension)- stable   DM type 2  (diabetes mellitus, type 2)- ssi   Alzheimer's disease- noted, must be very mild  obs on tele.  Should do well once starting antibiotics and should easily transition to oral.  Fedra Lanter A 06/16/2015, 12:56 AM

## 2015-06-16 NOTE — Progress Notes (Signed)
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  He also had a temp of 104, and CXR bilateral airspace disease, so he was given Rocephin and Zithromax IV.  RN said he has been more confused today, and had trouble getting out of his bed.  No HA, Vx problem, or obvious facial droop. No nausea, vomiting, coughs, fever or chills.     Rewiew of Systems:  Constitutional: Negative for malaise, fever and chills. No significant weight loss or weight gain Eyes: Negative for eye pain, redness and discharge, diplopia, visual changes, or flashes of light. ENMT: Negative for ear pain, hoarseness, nasal congestion, sinus pressure and sore throat. No headaches; tinnitus, drooling, or problem swallowing. Cardiovascular: Negative for chest pain, palpitations, diaphoresis, dyspnea and peripheral edema. ; No orthopnea, PND Respiratory: Negative for cough, hemoptysis, wheezing and stridor. No pleuritic chestpain. Gastrointestinal: Negative for nausea, vomiting, diarrhea, constipation, abdominal pain, melena, blood in stool, hematemesis, jaundice and rectal bleeding.    Genitourinary: Negative for frequency, dysuria, incontinence,flank pain and hematuria; Musculoskeletal: Negative for back pain and neck pain. Negative for swelling and trauma.;  Skin: . Negative for pruritus, rash, abrasions, bruising and skin lesion.; ulcerations Neuro: Negative for headache, lightheadedness and neck stiffness. Negative for weakness, altered level of consciousness , altered mental status, extremity weakness, burning feet, involuntary movement, seizure and  syncope.  Psych: negative for anxiety, depression, insomnia, tearfulness, panic attacks, hallucinations, paranoia, suicidal or homicidal ideation    Past Medical History  Diagnosis Date  . Diabetes mellitus   . Hypertension   . Hypercholesteremia   . Glaucoma   . Diverticulosis   . Prostatism   . Reflux   . Tremor   . Other general symptoms(780.99)   . Other vitamin B12 deficiency anemia   . Essential and other specified forms of tremor   . Abnormality of gait   . Memory loss   . Hearing difficulty of both ears     Bilateral hearing aids  . Macular degeneration     Past Surgical History  Procedure Laterality Date  . Leg surgery    . Tonsillectomy    . Eye surgery      Medications:  HOME MEDS: Prior to Admission medications   Medication Sig Start Date End Date Taking? Authorizing Provider  aspirin EC 81 MG tablet Take 81 mg by mouth daily.   Yes Historical Provider, MD  Calcium Carbonate-Vitamin D (CALCIUM 600+D) 600-400 MG-UNIT per tablet Take 1 tablet by mouth 2 (two) times daily.     Yes Historical Provider, MD  fexofenadine (ALLEGRA) 180 MG tablet Take 180 mg by mouth daily.    Yes Historical Provider, MD  Glucosamine HCl 1500 MG TABS Take 1,500 mg by mouth daily.   Yes Historical Provider, MD  lisinopril (PRINIVIL,ZESTRIL) 40 MG tablet Take 40 mg by mouth every evening.   Yes Historical Provider, MD  metFORMIN (GLUCOPHAGE) 500 MG tablet TAKE 1 TABLET BY MOUTH TWICE DAILY WITH MEALS. 05/01/15  Yes Babs Sciara, MD  Multiple Vitamins-Minerals (VITEYES AREDS FORMULA PO) Take 1 tablet by mouth 2 (  two) times daily.    Yes Historical Provider, MD  pravastatin (PRAVACHOL) 40 MG tablet Take 40 mg by mouth every evening.   Yes Historical Provider, MD  rivastigmine (EXELON) 4.6 mg/24hr Place 4.6 mg onto the skin daily.   Yes Historical Provider, MD  senna (SENOKOT) 8.6 MG TABS Take 1 tablet by mouth daily as needed (constipation).    Yes Historical Provider, MD  SIMBRINZA  1-0.2 % SUSP Place 1 drop into both eyes 2 (two) times daily. 08/21/14  Yes Historical Provider, MD  vitamin B-12 (CYANOCOBALAMIN) 100 MCG tablet Take 100 mcg by mouth 2 (two) times daily.     Yes Historical Provider, MD  vitamin C (ASCORBIC ACID) 500 MG tablet Take 500 mg by mouth daily.   Yes Historical Provider, MD  pravastatin (PRAVACHOL) 80 MG tablet TAKE ONE TABLET BY MOUTH ONCE DAILY. Patient not taking: Reported on 06/15/2015 05/01/15   Babs Sciara, MD     Allergies:  Allergies  Allergen Reactions  . Ciprofloxacin   . Bactrim Other (See Comments)    Shaking, patient doesn't recall this allergy     Social History:   reports that he quit smoking about 52 years ago. His smoking use included Cigarettes. He has never used smokeless tobacco. He reports that he does not drink alcohol or use illicit drugs.  Family History: Family History  Problem Relation Age of Onset  . Stroke Mother   . Heart failure Father   . Heart disease Brother   . Arthritis Brother      Physical Exam: Filed Vitals:   06/16/15 0000 06/16/15 0043 06/16/15 0206 06/16/15 0521  BP: 169/112  154/82 156/90  Pulse: 94  82 82  Temp:  98.1 F (36.7 C) 98.5 F (36.9 C) 97.9 F (36.6 C)  TempSrc:  Oral Oral Oral  Resp: 24   17  Height:   5\' 9"  (1.753 m)   Weight:   68.2 kg (150 lb 5.7 oz)   SpO2: 95%  96% 97%   Blood pressure 156/90, pulse 82, temperature 97.9 F (36.6 C), temperature source Oral, resp. rate 17, height 5\' 9"  (1.753 m), weight 68.2 kg (150 lb 5.7 oz), SpO2 97 %.  GEN:  Pleasant patient lying in the stretcher in no acute distress; cooperative with exam. PSYCH:  alert and oriented x4; does not appear anxious or depressed; affect is appropriate. HEENT: Mucous membranes pink and anicteric; PERRLA; EOM intact; no cervical lymphadenopathy nor thyromegaly or carotid bruit; no JVD; There were no stridor. Neck is very supple. Breasts:: Not examined CHEST WALL: No tenderness CHEST: Normal  respiration, clear to auscultation bilaterally.  HEART: Regular rate and rhythm.  There are no murmur, rub, or gallops.   BACK: No kyphosis or scoliosis; no CVA tenderness ABDOMEN: soft and non-tender; no masses, no organomegaly, normal abdominal bowel sounds; no pannus; no intertriginous candida. There is no rebound and no distention. Rectal Exam: Not done EXTREMITIES: No bone or joint deformity; age-appropriate arthropathy of the hands and knees; no edema; no ulcerations.  There is no calf tenderness. Genitalia: not examined PULSES: 2+ and symmetric SKIN: Normal hydration no rash or ulceration CNS: Cranial nerves 2-12 grossly intact no focal lateralizing neurologic deficit.  Speech is fluent; uvula elevated with phonation, facial symmetry and tongue midline. DTR are normal bilaterally, cerebella exam is intact, barbinski is negative and strengths are equaled bilaterally.  No sensory loss.   Labs on Admission:  Basic Metabolic Panel:  Recent Labs Lab 06/15/15 2222  06/16/15 0407  NA 134* 136  K 3.7 3.5  CL 101 103  CO2 23 24  GLUCOSE 162* 144*  BUN 20 19  CREATININE 1.21 1.17  CALCIUM 9.3 9.4   Liver Function Tests:  Recent Labs Lab 06/15/15 2222  AST 19  ALT 13*  ALKPHOS 72  BILITOT 0.2*  PROT 7.6  ALBUMIN 3.8   CBC:  Recent Labs Lab 06/15/15 2222 06/16/15 0407  WBC 8.2 8.7  NEUTROABS 5.6 5.6  HGB 13.6 14.2  HCT 39.5 41.1  MCV 96.8 97.2  PLT 195 193   Cardiac Enzymes:  Recent Labs Lab 06/15/15 2222  TROPONINI <0.03    CBG:  Recent Labs Lab 06/16/15 0755 06/16/15 1146  GLUCAP 156* 152*     Radiological Exams on Admission: Dg Chest 2 View  06/15/2015   CLINICAL DATA:  Acute onset of fever and cough. Generalized weakness and fatigue. Initial encounter.  EXAM: CHEST  2 VIEW  COMPARISON:  Chest radiograph performed 08/21/2014  FINDINGS: The lungs are well-aerated. Bibasilar airspace opacities may reflect mild interstitial edema. Vascular congestion  is noted. There is no evidence of pleural effusion or pneumothorax.  The heart is normal in size; the mediastinal contour is within normal limits. No acute osseous abnormalities are seen.  IMPRESSION: Bibasilar airspace opacities may reflect mild interstitial edema. Vascular congestion noted. Pneumonia might have a similar appearance.   Electronically Signed   By: Roanna Raider M.D.   On: 06/15/2015 22:10   Assessment/Plan Present on Admission:  . CAP (community acquired pneumonia) . HTN (hypertension) . Alzheimer's disease  PLAN:  Right facial droop, difficult with gait, confusion:  Suspicious that he had a CVA.  Will obtain speech evaluation, PT, and obtain MRI of the brain of the brain with and without contrast.    CAP:  Will continue with IV Zithromax and Rocephin.  Dementia:  Worsening likely from being in the hospital.  Will continue to observe.  Check B12, TSH, and HIV.    Other plans as per orders.  Code Status:  DNR. (confirmed today with wife and son)   Houston Siren, MD. [facility] Pager [phone] 7pm to 7am.  06/16/2015, 4:12 PM

## 2015-06-16 NOTE — Progress Notes (Signed)
ANTIBIOTIC CONSULT NOTE - INITIAL  Pharmacy Consult for Renal adjustment protocol (Rocephin & Zithromax) Indication: pneumonia  Allergies  Allergen Reactions  . Ciprofloxacin   . Bactrim Other (See Comments)    Shaking, patient doesn't recall this allergy    Patient Measurements: Height:  (175.3 cm) Weight: 150 lb 5.7 oz (68.2 kg) IBW/kg (Calculated) : 70.7  Vital Signs: Temp: 97.9 F (36.6 C) (08/08 0521) Temp Source: Oral (08/08 0521) BP: 156/90 mmHg (08/08 0521) Pulse Rate: 82 (08/08 0521) Intake/Output from previous day: 08/07 0701 - 08/08 0700 In: -  Out: 1200 [Urine:1200] Intake/Output from this shift:    Labs:  Recent Labs  06/15/15 2222 06/16/15 0407  WBC 8.2 8.7  HGB 13.6 14.2  PLT 195 193  CREATININE 1.21 1.17   Estimated Creatinine Clearance: 41.3 mL/min (by C-G formula based on Cr of 1.17). No results for input(s): VANCOTROUGH, VANCOPEAK, VANCORANDOM, GENTTROUGH, GENTPEAK, GENTRANDOM, TOBRATROUGH, TOBRAPEAK, TOBRARND, AMIKACINPEAK, AMIKACINTROU, AMIKACIN in the last 72 hours.   Microbiology: Recent Results (from the past 720 hour(s))  Culture, blood (routine x 2) Call MD if unable to obtain prior to antibiotics being given     Status: None (Preliminary result)   Collection Time: 06/16/15  4:07 AM  Result Value Ref Range Status   Specimen Description BLOOD RIGHT ARM  Final   Special Requests BOTTLES DRAWN AEROBIC AND ANAEROBIC 6CC  Final   Culture NO GROWTH < 12 HOURS  Final   Report Status PENDING  Incomplete  Culture, blood (routine x 2) Call MD if unable to obtain prior to antibiotics being given     Status: None (Preliminary result)   Collection Time: 06/16/15  4:11 AM  Result Value Ref Range Status   Specimen Description BLOOD LEFT HAND  Final   Special Requests BOTTLES DRAWN AEROBIC AND ANAEROBIC 6CC BOTTLES  Final   Culture NO GROWTH < 12 HOURS  Final   Report Status PENDING  Incomplete   Medical History: Past Medical History   Diagnosis Date  . Diabetes mellitus   . Hypertension   . Hypercholesteremia   . Glaucoma   . Diverticulosis   . Prostatism   . Reflux   . Tremor   . Other general symptoms(780.99)   . Other vitamin B12 deficiency anemia   . Essential and other specified forms of tremor   . Abnormality of gait   . Memory loss   . Hearing difficulty of both ears     Bilateral hearing aids  . Macular degeneration    Anti-infectives    Start     Dose/Rate Route Frequency Ordered Stop   06/16/15 2300  cefTRIAXone (ROCEPHIN) 1 g in dextrose 5 % 50 mL IVPB     1 g 100 mL/hr over 30 Minutes Intravenous Every 24 hours 06/16/15 0233 06/23/15 2259   06/16/15 2300  azithromycin (ZITHROMAX) 500 mg in dextrose 5 % 250 mL IVPB     500 mg 250 mL/hr over 60 Minutes Intravenous Every 24 hours 06/16/15 0233 06/23/15 2259   06/15/15 2345  cefTRIAXone (ROCEPHIN) 1 g in dextrose 5 % 50 mL IVPB     1 g 100 mL/hr over 30 Minutes Intravenous  Once 06/15/15 2337 06/16/15 0027   06/15/15 2345  azithromycin (ZITHROMAX) tablet 500 mg     500 mg Oral  Once 06/15/15 2337 06/15/15 2344     Assessment: 81 yo male lives alone very independent comes in with several days of cough, fever up to 104 not  feeling well. Lying in bed way more than normal, with chills. No n/v/d. No dysuria. Is sob and with nonproductive cough. Vitals stable, pt referred for admission for CAP mainly b/c of his advanced age, and living alone. SCr at baseline  Goal of Therapy:  Eradicate infection.  Plan:  Continue current Rx - no renal adjustment needed Switch Zithromax to PO when improved / appropriate Monitor labs, progress, and c/s Pharmacy will sign off  Valrie Hart A 06/16/2015,10:04 AM

## 2015-06-16 NOTE — Progress Notes (Signed)
Patient has had increasing difficulty and weakness when attempting to get up and out of bed/chair this afternoon. This morning patient was alert and oriented to self and able to to walk to bathroom with one assist, and this afternoon he is still alert and oriented to self, but having trouble pivoting from the bed to the [facility] with two assist. Family stated they feel that weakness has increased. Neuro assessment performed and at found to be at baseline, save left-sided grip which was slightly less than right-sided grip. MD notified of situation.

## 2015-06-16 NOTE — Progress Notes (Signed)
Pt was combative,kicking the sitter, and constantly trying to get out of bed. Mid-level notified, one time order for Ativan placed and an order for restraints was also placed. Ativan has been given, will continue to monitor and will reassess if restraints are needed.

## 2015-06-17 ENCOUNTER — Ambulatory Visit: Payer: Medicare Other | Admitting: Neurology

## 2015-06-17 DIAGNOSIS — F05 Delirium due to known physiological condition: Secondary | ICD-10-CM

## 2015-06-17 LAB — GLUCOSE, CAPILLARY
Glucose-Capillary: 148 mg/dL — ABNORMAL HIGH (ref 65–99)
Glucose-Capillary: 154 mg/dL — ABNORMAL HIGH (ref 65–99)
Glucose-Capillary: 178 mg/dL — ABNORMAL HIGH (ref 65–99)

## 2015-06-17 LAB — LEGIONELLA ANTIGEN, URINE

## 2015-06-17 LAB — HIV ANTIBODY (ROUTINE TESTING W REFLEX): HIV SCREEN 4TH GENERATION: NONREACTIVE

## 2015-06-17 MED ORDER — ZOLPIDEM TARTRATE 5 MG PO TABS
5.0000 mg | ORAL_TABLET | Freq: Once | ORAL | Status: DC
  Filled 2015-06-17: qty 1

## 2015-06-17 MED ORDER — LABETALOL HCL 5 MG/ML IV SOLN
10.0000 mg | INTRAVENOUS | Status: DC | PRN
  Filled 2015-06-17: qty 4

## 2015-06-17 NOTE — Progress Notes (Signed)
Patients blood pressure has been elevated today. Dr. Conley Rolls aware and ordered IV labetolol before transport to [facility]. Upon BP and HR check before Labetolol administration the blood pressure was 124/86 and heart rate 99. Will hold labetolol at this time.

## 2015-06-17 NOTE — Progress Notes (Signed)
New Admission Note:   Arrival Method:  Via Care link from [facility] Mental Orientation: Alert to self only Telemetry: n/a Assessment: Completed Skin: intact IV:  Pain: denies Tubes: n/a Safety Measures: Safety Fall Prevention Plan has been given, discussed and signed Admission: Completed [facility] Orientation: Patient has been orientated to the room, unit and staff.  Family: daughter and son at bedside  Orders have been reviewed and implemented. Will continue to monitor the patient. Call light has been placed within reach and bed alarm has been activated.   De Nurse BSN, Publishing copy number: [phone]

## 2015-06-17 NOTE — Progress Notes (Signed)
Report called to [place] on [facility] at [facility]. Patient in route.

## 2015-06-17 NOTE — Progress Notes (Signed)
Restraints were not used on pt, he was awake all night rambling about different things.

## 2015-06-17 NOTE — Evaluation (Signed)
Physical Therapy Evaluation Patient Details Name: Bradley Reed MRN: [id number] DOB: 17-Aug-1924 Today's Date: 06/17/2015   History of Present Illness  81 yo male lives alone very independent comes in with several days of cough, fever up to 104 not feeling well. Lying in bed way more than normal, with chills. No n/v/d. No dysuria. Is sob and with nonproductive cough. Vitals stable, pt referred for admission for CAP mainly b/c of his advanced age  Clinical Impression  Pt is currently very confused and hallucinating although he will respond to verbal and tactile cuing.  Pt normally ambulates with a cane but is unsafe with a cane at this time.  Pt needs a rolling walker and supervision assist which family states he will have at home.     Follow Up Recommendations Home health PT    Equipment Recommendations  Rolling walker with 5" wheels    Recommendations for Other Services   none    Precautions / Restrictions Precautions Precautions: Fall Restrictions Weight Bearing Restrictions: No      Mobility  Bed Mobility Overal bed mobility: Needs Assistance Bed Mobility: Supine to Sit     Supine to sit: Min assist        Transfers Overall transfer level: Modified independent Equipment used: Rolling walker (2 wheeled)                Ambulation/Gait Ambulation/Gait assistance: Min assist Ambulation Distance (Feet): 80 Feet Assistive device: Rolling walker (2 wheeled) Gait Pattern/deviations: Decreased step length - left;Decreased step length - right   Gait velocity interpretation: Below normal speed for age/gender             Pertinent Vitals/Pain Pain Assessment: Faces    Home Living Family/patient expects to be discharged to:: Private residence Living Arrangements: Spouse/significant other;Children Available Help at Discharge: Family Type of Home: House Home Access: Level entry     Home Layout: One level Home Equipment: Cane - single point       Prior Function Level of Independence: Independent with assistive device(s);Needs assistance   Gait / Transfers Assistance Needed: I with cane  ADL's / Homemaking Assistance Needed: wife and son do all homemaking        Hand Dominance        Extremity/Trunk Assessment               Lower Extremity Assessment: Generalized weakness         Communication   Communication: No difficulties  Cognition Arousal/Alertness: Awake/alert Behavior During Therapy: WFL for tasks assessed/performed Overall Cognitive Status: History of cognitive impairments - at baseline                               Assessment/Plan    PT Assessment Patient needs continued PT services  PT Diagnosis Generalized weakness;Difficulty walking;Altered mental status   PT Problem List Decreased strength;Decreased balance  PT Treatment Interventions Gait training;Therapeutic activities;Therapeutic exercise;Balance training   PT Goals (Current goals can be found in the Care Plan section) Acute Rehab PT Goals Patient Stated Goal: To go home PT Goal Formulation: With patient/family Potential to Achieve Goals: Good    Frequency Min 2X/week   Barriers to discharge  none         End of Session Equipment Utilized During Treatment: Gait belt Activity Tolerance: Patient tolerated treatment well Patient left: in chair;with call bell/phone within reach;with chair alarm set;with family/visitor present  Time: 1191-4782 PT Time Calculation (min) (ACUTE ONLY): 28 min   Charges:   PT Evaluation $Initial PT Evaluation Tier I: 1 Procedure     PT G CodesVirgina Organ, PT CLT [phone] 06/17/2015, 12:00 PM

## 2015-06-17 NOTE — Consult Note (Signed)
Neurology Consultation Reason for Consult: Altered mental status Referring Physician: Dayna Ramus  CC: Altered mental status  History is obtained from: Patient, family  HPI: Bradley Reed is a 81 y.o. male with a history of mild dementia who presented with fever, cough, but intact mentation. He was admitted to [facility] for treatment of community acquired pneumonia and over the course of the first evening of hospitalization became quite confused. He was started on azithromycin and ceftriaxone. He has had significant improvement in his fever curve, but continue to be confused today and therefore was transferred to [facility] given lack of neurology coverage at [facility].  Family states that he is doing actually slightly better this evening compared to earlier today, and that they were unable to understand pretty much anything he was saying earlier today.  He is still not near his baseline functionality which is living alone with his wife.  He was unable to produce much in the history, but is able to deny headache and answer simple questions.  He has been having some hallucinations and picking at things that are not there.    ROS:  Unable to obtain due to altered mental status.   Past Medical History  Diagnosis Date  . Diabetes mellitus   . Hypertension   . Hypercholesteremia   . Glaucoma   . Diverticulosis   . Prostatism   . Reflux   . Tremor   . Other general symptoms(780.99)   . Other vitamin B12 deficiency anemia   . Essential and other specified forms of tremor   . Abnormality of gait   . Memory loss   . Hearing difficulty of both ears     Bilateral hearing aids  . Macular degeneration     Family History: Unable to obtain due to altered mental status.  Social History: Tob: Unable to obtain due to altered mental status.  Exam: Current vital signs: BP 128/78 mmHg  Pulse 100  Temp(Src) 98.2 F (36.8 C) (Oral)  Resp 18  Ht 5\' 9"  (1.753 m)  Wt 66.996 kg (147 lb  11.2 oz)  BMI 21.80 kg/m2  SpO2 95% Vital signs in last 24 hours: Temp:  [98.2 F (36.8 C)-98.8 F (37.1 C)] 98.2 F (36.8 C) (08/09 2106) Pulse Rate:  [99-111] 100 (08/09 2106) Resp:  [18-20] 18 (08/09 2106) BP: (127-172)/(78-105) 128/78 mmHg (08/09 2106) SpO2:  [93 %-96 %] 95 % (08/09 2106) Weight:  [66.996 kg (147 lb 11.2 oz)] 66.996 kg (147 lb 11.2 oz) (08/09 0610)   Physical Exam  Constitutional: Appears elderly Psych: Affect appropriate to situation Eyes: No scleral injection HENT: No OP obstrucion Head: Normocephalic.  Cardiovascular: Normal rate and regular rhythm.  Respiratory: Effort normal  GI: Soft.  No distension. There is no tenderness.  Skin: WDI  Neuro: Mental Status: Patient is awake, alert, he thinks that he is in his office, does not know the year. He is unable to spell world backwards. He cooperates somewhat with exam, but is somewhat difficult to redirect at times. No signs of aphasia or neglect Cranial Nerves: II: Visual Fields are full. Pupils are equal, round, and reactive to light.   III,IV, VI: EOMI without ptosis or diploplia.  V: Facial sensation is symmetric to temperature VII: Facial movement is symmetric.  VIII: hearing is intact to voice X: Uvula elevates symmetrically XI: Shoulder shrug is symmetric. XII: tongue is midline without atrophy or fasciculations.  Motor: Tone is normal. Bulk is normal. 5/5 strength was present  in all four extremities.  Sensory: Sensation is symmetric to light touch and temperature in the arms and legs. Cerebellar: Does not cooperate fully, but no clear ataxia         I have reviewed labs in epic and the results pertinent to this consultation are: HIV-negative Blood cultures-negative at 1 day B12, TSH-normal  I have reviewed the images obtained: MRI brain-negative  Impression: 81 year old male with delirium in the setting of community acquired pneumonia. I do not have any reason at this time to  strongly suspect any more sinister etiology, especially given that he has had some improvement compared to earlier today.  At this time I would not favor any further evaluation, however if he does have continued worsening, or lack of improvement then further evaluation could be considered with that time.  Recommendations: 1) continue treatment of underlying infection per internal medicine team.  Ritta Slot, MD [facility] [phone]  If 7pm- 7am, please page neurology on call as listed in AMION.

## 2015-06-17 NOTE — Care Management Note (Signed)
Case Management Note  Patient Details  Name: Bradley Reed MRN: [id number] Date of Birth: Nov 17, 1923  Subjective/Objective:                  Pt admitted from home with pneumonia. Pt lives with his wife and has a supportive son and daughters. Pts wife does have to provide maximal assistance with ADl's.  Action/Plan: Awaiting PT eval. Will continue to follow for discharge planning needs.  Expected Discharge Date:                  Expected Discharge Plan:  Home w Home Health Services  In-House Referral:  NA  Discharge planning Services  CM Consult  Post Acute Care Choice:    Choice offered to:     DME Arranged:    DME Agency:     HH Arranged:    HH Agency:     Status of Service:  In process, will continue to follow  Medicare Important Message Given:    Date Medicare IM Given:    Medicare IM give by:    Date Additional Medicare IM Given:    Additional Medicare Important Message give by:     If discussed at Long Length of Stay Meetings, dates discussed:    Additional Comments:  Cheryl Flash, RN 06/17/2015, 2:45 PM

## 2015-06-17 NOTE — Evaluation (Signed)
Clinical/Bedside Swallow Evaluation Patient Details  Name: Bradley Reed MRN: [id number] Date of Birth: 1924-09-07  Today's Date: 06/17/2015 Time: SLP Start Time (ACUTE ONLY): 1706 SLP Stop Time (ACUTE ONLY): 1746 SLP Time Calculation (min) (ACUTE ONLY): 40 min  Past Medical History:  Past Medical History  Diagnosis Date  . Diabetes mellitus   . Hypertension   . Hypercholesteremia   . Glaucoma   . Diverticulosis   . Prostatism   . Reflux   . Tremor   . Other general symptoms(780.99)   . Other vitamin B12 deficiency anemia   . Essential and other specified forms of tremor   . Abnormality of gait   . Memory loss   . Hearing difficulty of both ears     Bilateral hearing aids  . Macular degeneration    Past Surgical History:  Past Surgical History  Procedure Laterality Date  . Leg surgery    . Tonsillectomy    . 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 He also had a temp of 104, and CXR bilateral airspace disease, so he was given Rocephin and Zithromax IV. RN said he has been more confused yesterday and today, and had a rapid decline in his cognition. Stroke was suspected, and MRI with and without contrast was done, but it was negative, showing no evidence of CVA. TSH, B12, and HIV were negative as well. He is very confused in just a couple of days. He had trouble getting out of his bed. No HA, Vx problem, or obvious facial droop. No nausea, vomiting, coughs, fever or chills at this time. MRI was negative. SLP asked to evaluate swallow due to decreased po intake, PNA, and cogntive changes.     Assessment / Plan / Recommendation Clinical Impression  Bradley Reed was eating his dinner  meal upon SLP arrival with son present. Son lives down the road and reports that pt does cough when he is eating and has done so for several years. His vocal quality is breathy, but this is baseline per family. Pt has become increasingly confused since being in the hospital and is followed by [facility] (Dr. Everlena Cooper) for dementia as an outpatient. Pt appears to be sundowning during this admission. Oral motor examination is unremarkable. Pt typically eats fresh fruits, raw vegetables, and lean meats without gravies. He turned up his noes at the [place] steak, collards, and white beans on his tray. His family brought carrots, broccoli, celery, and figs which he self fed without incident. No coughing noted today. Son reports occasional coughing with liquids. Pt with breathy vocal quality so he may have poor vocal fold closure during the swallow (and have difficulty with mixed consistencies and juicy fruits). Recommend regular diet with thin liquids with SLP to f/u x 1 for diet tolerance. Above to RN, patient, and family. Pt will likely be transferring to [facility] for neurology consult. Pt responded well to reassurance and redirection in room this evening.     Aspiration Risk  Mild    Diet Recommendation Age appropriate regular solids;Thin   Medication Administration: Whole meds with liquid    Other  Recommendations Oral Care Recommendations: Oral care  BID   Follow Up Recommendations       Frequency and Duration min 2x/week  1 week   Pertinent Vitals/Pain VSS    SLP Swallow Goals   Pt will demonstrate safe and efficient consumption of least restrictive diet with use of strategies as needed.    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 He also had a temp of 104, and CXR bilateral airspace disease, so he was given Rocephin and Zithromax IV. RN said he has been more confused yesterday and today, and had a rapid decline in his cognition. Stroke was suspected, and MRI with and without contrast was done, but it was negative, showing no evidence of CVA. TSH, B12, and HIV were negative as well. He is very confused in just a couple of days. He had trouble getting out of his bed. No HA, Vx problem, or obvious facial droop. No nausea, vomiting, coughs, fever or chills at this time. MRI was negative. SLP asked to evaluate swallow due to decreased po intake, PNA, and cogntive changes.   Type of Study: Bedside swallow evaluation Previous Swallow Assessment: none on record Diet Prior to this Study: Regular;Thin liquids Temperature Spikes Noted: No Respiratory Status: Supplemental O2 delivered via (comment) History of Recent Intubation: No Behavior/Cognition: Alert;Cooperative;Confused Oral Cavity - Dentition: Adequate natural dentition/normal for age Self-Feeding Abilities: Able to feed self Patient Positioning: Upright in bed Baseline Vocal Quality: Normal;Breathy (baseline breathiness per family) Volitional Cough: Strong Volitional Swallow: Able to elicit    Oral/Motor/Sensory Function Overall Oral Motor/Sensory Function: Appears within functional limits for tasks assessed Labial ROM: Within Functional Limits Labial Symmetry: Within Functional Limits Labial Strength: Within Functional Limits Labial Sensation: Within Functional Limits Lingual ROM: Within Functional Limits Lingual Symmetry: Within Functional Limits Lingual Strength: Within Functional Limits Lingual Sensation: Within Functional Limits Facial ROM: Within Functional Limits Facial Symmetry: Within Functional Limits Facial  Strength: Within Functional Limits Facial Sensation: Within Functional Limits Velum: Within Functional Limits Mandible: Within Functional Limits   Ice Chips Ice chips: Not tested   Thin Liquid Thin Liquid: Within functional limits Presentation: Cup;Straw;Self Fed    Nectar Thick Nectar Thick Liquid: Not tested   Honey Thick Honey Thick Liquid: Not tested   Puree Puree: Within functional limits Presentation: Spoon   Solid   Thank you,  Havery Moros, CCC-SLP [phone]     Solid: Within functional limits Presentation: Self Fed       Kamorah Nevils 06/17/2015,6:15 PM

## 2015-06-17 NOTE — Progress Notes (Signed)
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 He also had a temp of 104, and CXR bilateral airspace disease, so he was given Rocephin and Zithromax IV. RN said he has been more confused yesterday and today, and had a rapid decline in his cognition.  Stroke was suspected, and MRI with and without contrast was done, but it was negative, showing no evidence of CVA. TSH, B12, and HIV were negative as well.  He is very confused in just a couple of days. He had trouble getting out of his bed. No HA, Vx problem, or obvious facial droop. No nausea, vomiting, coughs, fever or chills at this time.     Rewiew of Systems: Negative, but he is unreliable.   Past Medical History  Diagnosis Date  . Diabetes mellitus   . Hypertension   . Hypercholesteremia   . Glaucoma   . Diverticulosis   . Prostatism   . Reflux   . Tremor   . Other general symptoms(780.99)   . Other vitamin B12 deficiency anemia   . Essential and other specified forms of tremor   . Abnormality of gait   . Memory loss   . Hearing difficulty of both ears     Bilateral hearing aids  . Macular degeneration     Past Surgical History  Procedure Laterality Date  . Leg surgery    . Tonsillectomy    . Eye surgery      Medications:  HOME MEDS: Prior to Admission medications   Medication Sig Start Date End Date Taking? Authorizing Provider  aspirin EC 81 MG tablet Take 81 mg by mouth daily.   Yes Historical Provider, MD  Calcium Carbonate-Vitamin D (CALCIUM 600+D) 600-400 MG-UNIT per tablet Take 1 tablet by mouth 2 (two) times daily.     Yes  Historical Provider, MD  fexofenadine (ALLEGRA) 180 MG tablet Take 180 mg by mouth daily.    Yes Historical Provider, MD  Glucosamine HCl 1500 MG TABS Take 1,500 mg by mouth daily.   Yes Historical Provider, MD  lisinopril (PRINIVIL,ZESTRIL) 40 MG tablet Take 40 mg by mouth every evening.   Yes Historical Provider, MD  metFORMIN (GLUCOPHAGE) 500 MG tablet TAKE 1 TABLET BY MOUTH TWICE DAILY WITH MEALS. 05/01/15  Yes Babs Sciara, MD  Multiple Vitamins-Minerals (VITEYES AREDS FORMULA PO) Take 1 tablet by mouth 2 (two) times daily.    Yes Historical Provider, MD  pravastatin (PRAVACHOL) 40 MG tablet Take 40 mg by mouth every evening.   Yes Historical Provider, MD  rivastigmine (EXELON) 4.6 mg/24hr Place 4.6 mg onto the skin daily.   Yes Historical Provider, MD  senna (SENOKOT) 8.6 MG TABS Take 1 tablet by mouth daily as needed (constipation).    Yes Historical Provider, MD  SIMBRINZA 1-0.2 % SUSP Place 1 drop into both eyes 2 (two) times daily. 08/21/14  Yes Historical Provider, MD  vitamin B-12 (CYANOCOBALAMIN) 100 MCG tablet Take 100 mcg by mouth 2 (two) times daily.     Yes Historical Provider, MD  vitamin C (ASCORBIC ACID) 500 MG  tablet Take 500 mg by mouth daily.   Yes Historical Provider, MD  pravastatin (PRAVACHOL) 80 MG tablet TAKE ONE TABLET BY MOUTH ONCE DAILY. Patient not taking: Reported on 06/15/2015 05/01/15   Babs Sciara, MD     Allergies:  Allergies  Allergen Reactions  . Ciprofloxacin   . Bactrim Other (See Comments)    Shaking, patient doesn't recall this allergy     Social History:   reports that he quit smoking about 52 years ago. His smoking use included Cigarettes. He has never used smokeless tobacco. He reports that he does not drink alcohol or use illicit drugs.  Family History: Family History  Problem Relation Age of Onset  . Stroke Mother   . Heart failure Father   . Heart disease Brother   . Arthritis Brother      Physical Exam: Filed Vitals:    06/16/15 0521 06/16/15 2159 06/17/15 0610 06/17/15 1446  BP: 156/90 163/85 172/105 156/95  Pulse: 82 88  111  Temp: 97.9 F (36.6 C) 99.7 F (37.6 C)  98.8 F (37.1 C)  TempSrc: Oral Oral  Oral  Resp: Height:      Weight:   66.996 kg (147 lb 11.2 oz)   SpO2: 97% 95% 96% 93%   Blood pressure 156/95, pulse 111, temperature 98.8 F (37.1 C), temperature source Oral, resp. rate 20, height  (1.753 m), weight 66.996 kg (147 lb 11.2 oz), SpO2 93 %.  GEN:  Pleasant  patient lying in the stretcher in no acute distress; cooperative with exam. PSYCH:  alert and is confused. does not appear anxious or depressed; affect is appropriate. HEENT: Mucous membranes pink and anicteric; PERRLA; EOM intact; no cervical lymphadenopathy nor thyromegaly or carotid bruit; no JVD; There were no stridor. Neck is very supple. Breasts:: Not examined CHEST WALL: No tenderness CHEST: Normal respiration, clear to auscultation bilaterally.  HEART: Regular rate and rhythm.  There are no murmur, rub, or gallops.   BACK: No kyphosis or scoliosis; no CVA tenderness ABDOMEN: soft and non-tender; no masses, no organomegaly, normal abdominal bowel sounds; no pannus; no intertriginous candida. There is no rebound and no distention. Rectal Exam: Not done EXTREMITIES: No bone or joint deformity; age-appropriate arthropathy of the hands and knees; no edema; no ulcerations.  There is no calf tenderness. Genitalia: not examined PULSES: 2+ and symmetric SKIN: Normal hydration no rash or ulceration CNS: Cranial nerves 2-12 grossly intact no focal lateralizing neurologic deficit.  Speech is fluent; uvula elevated with phonation, facial symmetry and tongue midline. DTR are normal bilaterally, cerebella exam is intact, barbinski is negative and strengths are equaled bilaterally.  No sensory loss.   Labs on Admission:  Basic Metabolic Panel:  Recent Labs Lab 06/15/15 2222 06/16/15 0407  NA 134* 136  K 3.7 3.5   CL 101 103  CO2 23 24  GLUCOSE 162* 144*  BUN 20 19  CREATININE 1.21 1.17  CALCIUM 9.3 9.4   Liver Function Tests:  Recent Labs Lab 06/15/15 2222  AST 19  ALT 13*  ALKPHOS 72  BILITOT 0.2*  PROT 7.6  ALBUMIN 3.8    CBC:  Recent Labs Lab 06/15/15 2222 06/16/15 0407  WBC 8.2 8.7  NEUTROABS 5.6 5.6  HGB 13.6 14.2  HCT 39.5 41.1  MCV 96.8 97.2  PLT 195 193   Cardiac Enzymes:  Recent Labs Lab 06/15/15 2222  TROPONINI <0.03    CBG:  Recent Labs Lab 06/16/15 1146 06/16/15  1628 06/16/15 2021 06/17/15 0747 06/17/15 1127  GLUCAP 152* 168* 185* 148* 154*     Radiological Exams on Admission: Dg Chest 2 View  06/15/2015   CLINICAL DATA:  Acute onset of fever and cough. Generalized weakness and fatigue. Initial encounter.  EXAM: CHEST  2 VIEW  COMPARISON:  Chest radiograph performed 08/21/2014  FINDINGS: The lungs are well-aerated. Bibasilar airspace opacities may reflect mild interstitial edema. Vascular congestion is noted. There is no evidence of pleural effusion or pneumothorax.  The heart is normal in size; the mediastinal contour is within normal limits. No acute osseous abnormalities are seen.  IMPRESSION: Bibasilar airspace opacities may reflect mild interstitial edema. Vascular congestion noted. Pneumonia might have a similar appearance.   Electronically Signed   By: Roanna Raider M.D.   On: 06/15/2015 22:10   Mr Laqueta Jean ZO Contrast  06/16/2015   CLINICAL DATA:  81 year old male with altered mental status. Confusion. Dysphagia. Initial encounter.  EXAM: MRI HEAD WITHOUT AND WITH CONTRAST  TECHNIQUE: Multiplanar, multiecho pulse sequences of the brain and surrounding structures were obtained without and with intravenous contrast.  CONTRAST:  14mL MULTIHANCE GADOBENATE DIMEGLUMINE 529 MG/ML IV SOLN  COMPARISON:  Head CT without contrast 03/21/2013. Brain MRI 03/19/2011.  FINDINGS: Study is intermittently degraded by motion artifact despite repeated imaging  attempts.  Major intracranial vascular flow voids are stable, with dominant left vertebral artery Re identified. Cerebral volume has not significantly changed since 2012 and is within normal limits for age. No restricted diffusion to suggest acute infarction. No midline shift, mass effect, evidence of mass lesion, ventriculomegaly, extra-axial collection or acute intracranial hemorrhage. Cervicomedullary junction and pituitary are within normal limits.  Stable mild for age nonspecific and scattered cerebral white matter T2 and FLAIR hyperintensity. No cortical encephalomalacia or chronic cerebral blood products identified. No abnormal enhancement identified.  Grossly normal visualized internal auditory structures. Visualized paranasal sinuses and mastoids are clear. Stable orbit and scalp soft tissues. Stable visualized cervical spine.  IMPRESSION: No acute intracranial abnormality.  Stable MRI appearance of the brain since 2012.   Electronically Signed   By: Odessa Fleming M.D.   On: 06/16/2015 19:08   Assessment/Plan Present on Admission:  . CAP (community acquired pneumonia) . HTN (hypertension) . Alzheimer's disease  PLAN:  I spoke with his son today, and confirmed that has been acutely confused and declined over the past 3 days.  It could be that he has had dementia, and worsen his mental status due to possible PNA and new environment, but his rapid deterioration is concerning.  I spoke with Dr Vonita Moss of neurology, who will see him in consultation, since we have no neurology service this week at [facility].  He was kind to offer consultation upon patient's arrival at [facility].  Will continue with his Tx for PNA.  He will also needs SNF placement as his wife and son are not going to be able to care for him at home, unless of course, he will improve markedly.  PT has been consulted.  Dr Marlin Canary was kind to accept him in transfer for the hospitalist service. Thank you so much for your help.  Other plans as per  orders.  Code Status: DNR.    Houston Siren, MD. [facility] Pager [phone] 7pm to 7am.  06/17/2015, 3:37 PM

## 2015-06-18 LAB — URINE CULTURE: CULTURE: NO GROWTH

## 2015-06-18 LAB — GLUCOSE, CAPILLARY: Glucose-Capillary: 126 mg/dL — ABNORMAL HIGH (ref 65–99)

## 2015-06-18 MED ORDER — INSULIN ASPART 100 UNIT/ML ~~LOC~~ SOLN
0.0000 [IU] | Freq: Three times a day (TID) | SUBCUTANEOUS | Status: DC
  Administered 2015-06-19: 1 [IU] via SUBCUTANEOUS

## 2015-06-18 NOTE — Progress Notes (Signed)
06/18/2015 1:03 PM  Patient family members have arrived to the bedside to speak with the MD regarding patient transfer. MD notified. MD at bedside. Will continue to assess and monitor the patient.   [employer], RN-BC, [employer] [facility] Phone [phone]

## 2015-06-18 NOTE — Progress Notes (Signed)
Physical Therapy Treatment Patient Details Name: Bradley Reed MRN: [id number] DOB: 06/10/1924 Today's Date: 06/18/2015    History of Present Illness Bradley Reed is a 81 y.o. male with a history of mild dementia who presented with fever, cough, but intact mentation. He was admitted to [facility] for treatment of community acquired pneumonia and over the course of the first evening of hospitalization became quite confused    PT Comments    Improving activity tolerance; Family reports they plan to help pt and wife more at home, and that they would like to bring Bradley Reed home after hospital stay; I agree -- home with familiar people and routine tends to be the most therapeutic place for pts with dementia   Follow Up Recommendations  Home health PT     Equipment Recommendations  Rolling walker with 5" wheels    Recommendations for Other Services       Precautions / Restrictions Precautions Precautions: Fall    Mobility  Bed Mobility Overal bed mobility: Needs Assistance Bed Mobility: Supine to Sit     Supine to sit: Min assist     General bed mobility comments: Tactile cueing for technique and to weight shift to scoot to EOB; inefficient movement patterns;   Transfers Overall transfer level: Needs assistance Equipment used: Rolling walker (2 wheeled) Transfers: Sit to/from Stand Sit to Stand: Mod assist         General transfer comment: Mod assist to help shift weight forward over feet as he had a posterior bias  Ambulation/Gait Ambulation/Gait assistance: Min assist Ambulation Distance (Feet): 100 Feet Assistive device: Rolling walker (2 wheeled) Gait Pattern/deviations: Step-through pattern (quite variable step length, though overall decreased)   Gait velocity interpretation: Below normal speed for age/gender General Gait Details: tactile cueing for upright posture; min assist for progression of RW and management; would occasionally run into objects;  lots of cueing for pathfinding   Stairs            Wheelchair Mobility    Modified Rankin (Stroke Patients Only)       Balance                                    Cognition Arousal/Alertness: Awake/alert Behavior During Therapy: WFL for tasks assessed/performed Overall Cognitive Status: History of cognitive impairments - at baseline                      Exercises      General Comments        Pertinent Vitals/Pain Pain Assessment: No/denies pain    Home Living                      Prior Function            PT Goals (current goals can now be found in the care plan section) Acute Rehab PT Goals Patient Stated Goal: To go home PT Goal Formulation: With patient/family Potential to Achieve Goals: Good Progress towards PT goals: Progressing toward goals    Frequency  Min 3X/week    PT Plan Discharge plan needs to be updated    Co-evaluation             End of Session Equipment Utilized During Treatment: Gait belt Activity Tolerance: Patient tolerated treatment well Patient left: in chair;with call bell/phone within reach;with chair alarm set;with family/visitor present  Time: 1610-9604 PT Time Calculation (min) (ACUTE ONLY): 33 min  Charges:  $Gait Training: 8-22 mins $Therapeutic Activity: 8-22 mins                    G Codes:      Bradley Reed 06/18/2015, 4:26 PM  Bradley Reed, [place]  Acute Rehabilitation Services Pager [phone] Office [phone]

## 2015-06-18 NOTE — Progress Notes (Signed)
06/18/2015 10:06 AM Called patient's family concerning patient's family request to transfer to [facility]. Patients family stated they will come back to speak with doctor. Will inform MD when family is at bedside. Continuing to monitor and assess the patient. Stark Klein, RN

## 2015-06-18 NOTE — Progress Notes (Addendum)
[facility] PROGRESS NOTE  JAHLON BAINES ZOX:[id number] DOB: February 16, 1924 DOA: 06/15/2015 PCP: Lubertha South, MD  Assessment/Plan: 1. Delirium- secondary to community acquired pneumonia, patient was seen by neurology at this time they recommend continuing with antibiotic therapy for underlying infection. Continue sitter at bedside. 2. Dementia- patient has underlying Alzheimer's disease. Has behavior disturbance.  3. Diabetes mellitus- start sliding-scale insulin with NovoLog 4. 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 He also had a temp of 104, and CXR bilateral airspace disease, so he was given Rocephin and Zithromax IV. RN said he has been more confused yesterday and today, and had a rapid decline in his cognition. Stroke was suspected, and MRI with and without contrast was done, but it was negative, showing no evidence of CVA. TSH, B12, and HIV were negative as well. He is very confused in just a couple of days. Patient's chest x-ray showed pneumonia, started on Rocephin and Zithromax. Patient continues to be intermittently confused, worsening of confusion at bedtime. As per family patient does have history of mild dementia. Today patient answering questions appropriately  Objective: Filed Vitals:   06/18/15 0805  BP: 147/92  Pulse: 95  Temp: 98.6 F (37 C)  Resp: 18    Intake/Output Summary (Last 24 hours) at 06/18/15 1556 Last data filed at 06/18/15 1413   Gross per 24 hour  Intake 3403.75 ml  Output    350 ml  Net 3053.75 ml   Filed Weights   06/16/15 0206 06/17/15 0610 06/18/15 0610  Weight: 68.2 kg (150 lb 5.7 oz) 66.996 kg (147 lb 11.2 oz) 70.1 kg (154 lb 8.7 oz)    Exam:   General:  Appears in no acute distress  Cardiology S1-S2 is regular  Respiratory: Clear to auscultation bilaterally  AbdSoft, nontender, no organomegaly  Musculoskeletal: no cyanosis no clubbing no edema of lower extremities  Data Reviewed: Basic Metabolic Panel:  Recent Labs Lab 06/15/15 2222 06/16/15 0407  NA 134* 136  K 3.7 3.5  CL 101 103  CO2 23 24  GLUCOSE 162* 144*  BUN 20 19  CREATININE 1.21 1.17  CALCIUM 9.3 9.4   Liver Function Tests:  Recent Labs Lab 06/15/15 2222  AST 19  ALT 13*  ALKPHOS 72  BILITOT 0.2*  PROT 7.6  ALBUMIN 3.8   No results for input(s): LIPASE, AMYLASE in the last 168 hours. No results for input(s): AMMONIA in the last 168 hours. CBC:  Recent Labs Lab 06/15/15 2222 06/16/15 0407  WBC 8.2 8.7  NEUTROABS 5.6 5.6  HGB 13.6 14.2  HCT 39.5 41.1  MCV 96.8 97.2  PLT 195 193   Cardiac Enzymes:  Recent Labs Lab 06/15/15 2222  TROPONINI <0.03   BNP (last 3 results) No results for input(s): BNP in the last 8760 hours.  ProBNP (last 3 results) No results for input(s): PROBNP in the last 8760 hours.  CBG:  Recent Labs Lab 06/16/15 1628 06/16/15 2021 06/17/15 0747 06/17/15 1127 06/17/15 1648  GLUCAP 168*  185* 148* 154* 178*    Recent Results (from the past 240 hour(s))  Urine culture     Status: None   Collection Time: 06/15/15 11:44 PM  Result Value Ref Range Status   Specimen Description URINE, CLEAN CATCH  Final   Special Requests NONE  Final   Culture   Final    NO GROWTH 2 DAYS Performed at [facility]    Report Status 06/18/2015 FINAL  Final  Culture, blood (routine x 2) Call MD if unable to obtain prior to antibiotics being given     Status: None (Preliminary  result)   Collection Time: 2015/07/11  4:07 AM  Result Value Ref Range Status   Specimen Description BLOOD RIGHT ANTECUBITAL  Final   Special Requests BOTTLES DRAWN AEROBIC AND ANAEROBIC 6CC  Final   Culture NO GROWTH 2 DAYS  Final   Report Status PENDING  Incomplete  Culture, blood (routine x 2) Call MD if unable to obtain prior to antibiotics being given     Status: None (Preliminary result)   Collection Time: 11-Jul-2015  4:11 AM  Result Value Ref Range Status   Specimen Description BLOOD LEFT HAND  Final   Special Requests BOTTLES DRAWN AEROBIC AND ANAEROBIC 6CC BOTTLES  Final   Culture NO GROWTH 2 DAYS  Final   Report Status PENDING  Incomplete     Studies: Mr Lodema Pilot Contrast  2015/07/11   CLINICAL DATA:  81 year old male with altered mental status. Confusion. Dysphagia. Initial encounter.  EXAM: MRI HEAD WITHOUT AND WITH CONTRAST  TECHNIQUE: Multiplanar, multiecho pulse sequences of the brain and surrounding structures were obtained without and with intravenous contrast.  CONTRAST:  14mL MULTIHANCE GADOBENATE DIMEGLUMINE 529 MG/ML IV SOLN  COMPARISON:  Head CT without contrast 03/21/2013. Brain MRI 03/19/2011.  FINDINGS: Study is intermittently degraded by motion artifact despite repeated imaging attempts.  Major intracranial vascular flow voids are stable, with dominant left vertebral artery Re identified. Cerebral volume has not significantly changed since 2012 and is within normal limits for age. No restricted diffusion to suggest acute infarction. No midline shift, mass effect, evidence of mass lesion, ventriculomegaly, extra-axial collection or acute intracranial hemorrhage. Cervicomedullary junction and pituitary are within normal limits.  Stable mild for age nonspecific and scattered cerebral white matter T2 and FLAIR hyperintensity. No cortical encephalomalacia or chronic cerebral blood products identified. No abnormal enhancement identified.  Grossly normal visualized internal auditory  structures. Visualized paranasal sinuses and mastoids are clear. Stable orbit and scalp soft tissues. Stable visualized cervical spine.  IMPRESSION: No acute intracranial abnormality.  Stable MRI appearance of the brain since 2012.   Electronically Signed   By: Odessa Fleming M.D.   On: 07-11-15 19:08    Scheduled Meds: . azithromycin  500 mg Intravenous Q24H  . cefTRIAXone (ROCEPHIN)  IV  1 g Intravenous Q24H  . enoxaparin (LOVENOX) injection  40 mg Subcutaneous Q24H  . zolpidem  5 mg Oral Once   Continuous Infusions: . sodium chloride 75 mL/hr at 06/18/15 1215    Principal Problem:   CAP (community acquired pneumonia) Active Problems:   HTN (hypertension)   DM type 2 (diabetes mellitus, type 2)   Alzheimer's disease    Time spent: *25 min    [facility]  [facility] Pager [phone]*. If 7PM-7AM, please contact night-coverage at [email or web address], password [facility] 06/18/2015, 3:56 PM  LOS: 2 days

## 2015-06-18 NOTE — Progress Notes (Signed)
Speech Language Pathology Treatment: Dysphagia  Patient Details Name: Bradley Reed MRN: [id number] DOB: 1923-11-29 Today's Date: 06/18/2015 Time: 7829-5621 SLP Time Calculation (min) (ACUTE ONLY): 16 min  Assessment / Plan / Recommendation Clinical Impression  Treatment session focused on addressing skilled observation of current diet.  SLP set-up Dys.1 and regular textures with thin liquids via straw.  Patient consumed solid consistencies with a slow rate and small portions; effective mastication, oral clearance and multiple swallows were observed.  As session progressed, fatigue versus environmental distractions impacted attention to boluses and resulted in oral holding and intermittent throat clearing when thin liquids were consumed via straw.  SLP limited distractions and removed straw which reduced s/s of aspiration.  Sitter at bedside and was educated on how to assist with meal supervision; sign posted over head of bed to facilitate carryover.  SLP will continue to follow briefly.  Suspect that as mentation improves so will swallow function.     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 He also had a temp of 104, and CXR bilateral airspace disease, so he was given Rocephin and Zithromax IV. RN said he has been more confused yesterday and today, and had a rapid decline in his cognition. Stroke was suspected, and MRI with and without contrast was done, but it was negative, showing no evidence of CVA. TSH, B12, and HIV were negative as well. He is very confused in just a couple of days. He had trouble getting out of his bed. No HA, Vx problem, or obvious facial droop. No nausea, vomiting,  coughs, fever or chills at this time. MRI was negative. SLP asked to evaluate swallow due to decreased po intake, PNA, and cogntive changes.  Patient transferred to [facility] for neurology consult and SLP following up on toleration.    Pertinent Vitals Pain Assessment: No/denies pain  SLP Plan  Continue with current plan of care    Recommendations Diet recommendations: Regular;Thin liquid Liquids provided via: Cup;No straw Medication Administration: Whole meds with puree Supervision: Patient able to self feed;Full supervision/cueing for compensatory strategies Compensations: Slow rate;Small sips/bites Postural Changes and/or Swallow Maneuvers: Seated upright 90 degrees              Oral Care Recommendations: Oral care BID Follow up Recommendations: 24 hour supervision/assistance Plan: Continue with current plan of care    GO    Charlane Ferretti., CCC-SLP [phone]  [name] 06/18/2015, 12:17 PM

## 2015-06-18 NOTE — Care Management Important Message (Signed)
Important Message  Patient Details  Name: Bradley Reed MRN: [id number] Date of Birth: 14-Apr-1924   Medicare Important Message Given:  Yes-second notification given    Orson Aloe 06/18/2015, 11:30 AM

## 2015-06-18 NOTE — Progress Notes (Signed)
Restraints ordered for patient but family refused to have them placed. Pt not combative. MD made aware.

## 2015-06-19 DIAGNOSIS — E119 Type 2 diabetes mellitus without complications: Secondary | ICD-10-CM

## 2015-06-19 LAB — HEMOGLOBIN A1C
Hgb A1c MFr Bld: 7.1 % — ABNORMAL HIGH (ref 4.8–5.6)
Mean Plasma Glucose: 157 mg/dL

## 2015-06-19 LAB — GLUCOSE, CAPILLARY
GLUCOSE-CAPILLARY: 157 mg/dL — AB (ref 65–99)
Glucose-Capillary: 170 mg/dL — ABNORMAL HIGH (ref 65–99)
Glucose-Capillary: 150 mg/dL — ABNORMAL HIGH (ref 65–99)
Glucose-Capillary: 167 mg/dL — ABNORMAL HIGH (ref 65–99)

## 2015-06-19 NOTE — Progress Notes (Signed)
[facility] PROGRESS NOTE  Bradley SCHILDT ZOX:[id number] DOB: 07/29/1924 DOA: 06/15/2015 PCP: Lubertha South, MD  Assessment/Plan: 1. Delirium- resolving, secondary to community acquired pneumonia, patient was seen by neurology at this time they recommend continuing with antibiotic therapy for underlying infection.  2. Dementia- patient has underlying Alzheimer's disease. No behavior disturbance today. 3. Diabetes mellitus- sliding-scale insulin with NovoLog 4. 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 He also had a temp of 104, and CXR bilateral airspace disease, so he was given Rocephin and Zithromax IV. RN said he has been more confused yesterday and today, and had a rapid decline in his cognition. Stroke was suspected, and MRI with and without contrast was done, but it was negative, showing no evidence of CVA. TSH, B12, and HIV were negative as well. He is very confused in just a couple of days. Patient's chest x-ray showed pneumonia, started on Rocephin and Zithromax. Patient continues to be intermittently confused, worsening of confusion at bedtime. As per family patient does have history of mild dementia. Patient more relaxed this morning, able to answer questions. Though not oriented to place and time  Objective: Filed Vitals:   06/19/15 0915  BP: 155/92  Pulse: 80  Temp: 98.2 F (36.8 C)  Resp: 18    Intake/Output Summary (Last 24 hours) at  06/19/15 1526 Last data filed at 06/19/15 0916  Gross per 24 hour  Intake 1701.25 ml  Output   1950 ml  Net -248.75 ml   Filed Weights   06/17/15 0610 06/18/15 0610 06/18/15 1932  Weight: 66.996 kg (147 lb 11.2 oz) 70.1 kg (154 lb 8.7 oz) 67.858 kg (149 lb 9.6 oz)    Exam:   General:  Appears in no acute distress  Cardiology S1-S2 is regular  Respiratory: Clear to auscultation bilaterally  AbdSoft, nontender, no organomegaly  Musculoskeletal: no cyanosis no clubbing no edema of lower extremities  Data Reviewed: Basic Metabolic Panel:  Recent Labs Lab 06/15/15 2222 06/16/15 0407  NA 134* 136  K 3.7 3.5  CL 101 103  CO2 23 24  GLUCOSE 162* 144*  BUN 20 19  CREATININE 1.21 1.17  CALCIUM 9.3 9.4   Liver Function Tests:  Recent Labs Lab 06/15/15 2222  AST 19  ALT 13*  ALKPHOS 72  BILITOT 0.2*  PROT 7.6  ALBUMIN 3.8   No results for input(s): LIPASE, AMYLASE in the last 168 hours. No results for input(s): AMMONIA in the last 168 hours. CBC:  Recent Labs Lab 06/15/15 2222 06/16/15 0407  WBC 8.2 8.7  NEUTROABS 5.6 5.6  HGB 13.6 14.2  HCT 39.5 41.1  MCV 96.8 97.2  PLT 195 193   Cardiac Enzymes:  Recent Labs Lab 06/15/15 2222  TROPONINI <0.03   BNP (last 3 results) No results for input(s): BNP in the last 8760 hours.  ProBNP (last 3 results) No results for input(s): PROBNP in the last 8760 hours.  CBG:  Recent Labs Lab 06/17/15 1127 06/17/15  1648 06/18/15 2135 06/19/15 0842 06/19/15 1201  GLUCAP 154* 178* 126* 157* 170*    Recent Results (from the past 240 hour(s))  Urine culture     Status: None   Collection Time: 06/15/15 11:44 PM  Result Value Ref Range Status   Specimen Description URINE, CLEAN CATCH  Final   Special Requests NONE  Final   Culture   Final    NO GROWTH 2 DAYS Performed at [facility]    Report Status 06/18/2015 FINAL  Final  Culture, blood (routine x 2) Call MD if unable to obtain prior to  antibiotics being given     Status: None (Preliminary result)   Collection Time: 06/16/15  4:07 AM  Result Value Ref Range Status   Specimen Description BLOOD RIGHT ANTECUBITAL  Final   Special Requests BOTTLES DRAWN AEROBIC AND ANAEROBIC 6CC  Final   Culture NO GROWTH 3 DAYS  Final   Report Status PENDING  Incomplete  Culture, blood (routine x 2) Call MD if unable to obtain prior to antibiotics being given     Status: None (Preliminary result)   Collection Time: 06/16/15  4:11 AM  Result Value Ref Range Status   Specimen Description BLOOD LEFT HAND  Final   Special Requests BOTTLES DRAWN AEROBIC AND ANAEROBIC 6CC BOTTLES  Final   Culture NO GROWTH 3 DAYS  Final   Report Status PENDING  Incomplete     Studies: No results found.  Scheduled Meds: . azithromycin  500 mg Intravenous Q24H  . cefTRIAXone (ROCEPHIN)  IV  1 g Intravenous Q24H  . enoxaparin (LOVENOX) injection  40 mg Subcutaneous Q24H  . insulin aspart  0-9 Units Subcutaneous TID WC  . zolpidem  5 mg Oral Once   Continuous Infusions: . sodium chloride 75 mL/hr at 06/18/15 1215    Principal Problem:   CAP (community acquired pneumonia) Active Problems:   HTN (hypertension)   DM type 2 (diabetes mellitus, type 2)   Alzheimer's disease    Time spent: *25 min    [facility] S  [facility] Pager [phone]*. If 7PM-7AM, please contact night-coverage at [email or web address], password [facility] 06/19/2015, 3:26 PM  LOS: 3 days

## 2015-06-20 LAB — GLUCOSE, CAPILLARY
Glucose-Capillary: 145 mg/dL — ABNORMAL HIGH (ref 65–99)
Glucose-Capillary: 167 mg/dL — ABNORMAL HIGH (ref 65–99)

## 2015-06-20 MED ORDER — DEXTROSE 5 % IV SOLN
1.0000 g | INTRAVENOUS | Status: AC
  Administered 2015-06-20: 1 g via INTRAVENOUS
  Filled 2015-06-20: qty 10

## 2015-06-20 MED ORDER — LISINOPRIL 40 MG PO TABS: 20.0000 mg | ORAL_TABLET | Freq: Every evening | ORAL | Status: DC

## 2015-06-20 MED ORDER — DEXTROSE 5 % IV SOLN
500.0000 mg | INTRAVENOUS | Status: AC
  Administered 2015-06-20: 500 mg via INTRAVENOUS
  Filled 2015-06-20: qty 500

## 2015-06-20 MED ORDER — AMLODIPINE BESYLATE 5 MG PO TABS: 5.0000 mg | ORAL_TABLET | Freq: Every day | ORAL | Status: DC

## 2015-06-20 NOTE — Progress Notes (Signed)
Physical Therapy Treatment Patient Details Name: Bradley Reed MRN: [id number] DOB: 1923-12-31 Today's Date: 06/20/2015    History of Present Illness Bradley Reed is a 81 y.o. male with a history of mild dementia who presented with fever, cough, but intact mentation. He was admitted to [facility] for treatment of community acquired pneumonia and over the course of the first evening of hospitalization became quite confused    PT Comments    Pt is pleasantly confused, family reports hospital is disorienting for him. He walked 140' with hand held assist today. He requires verbal and physical cues for safety. With use of RW he had poor safety awareness, recommended family use hand held assist for now.  I expect he'll be more comfortable in his familiar home environment.   Follow Up Recommendations  Home health PT     Equipment Recommendations  None recommended by PT    Recommendations for Other Services       Precautions / Restrictions Precautions Precautions: Fall Restrictions Weight Bearing Restrictions: No    Mobility  Bed Mobility               General bed mobility comments: NT-up in chair  Transfers Overall transfer level: Needs assistance Equipment used: Rolling walker (2 wheeled) Transfers: Sit to/from Stand Sit to Stand: Mod assist         General transfer comment: Mod assist to help shift weight forward over feet as he had a posterior bias, mod assist to guide hips to commode, verbal cues for hand placement  Ambulation/Gait Ambulation/Gait assistance: Min assist Ambulation Distance (Feet): 140 Feet Assistive device: Rolling walker (2 wheeled);1 person hand held assist Gait Pattern/deviations: Step-to pattern;Trunk flexed;Shuffle Gait velocity: decr   General Gait Details: trial of both RW and HHA with ambulation, pt seemed confused and had decrease safety awareness with use of RW, he frequently let go of it, he was more steady with HHA of 1, pt's  daughter present for tx, instructed her in HHA with use of gait belt during ambulation with her father,    Stairs            Wheelchair Mobility    Modified Rankin (Stroke Patients Only)       Balance Overall balance assessment: Needs assistance   Sitting balance-Leahy Scale: Good       Standing balance-Leahy Scale: Fair                      Cognition Arousal/Alertness: Awake/alert Behavior During Therapy: WFL for tasks assessed/performed Overall Cognitive Status: Impaired/Different from baseline (h/o dementia, confusion is worse than baseline per daughter,) Area of Impairment: Safety/judgement;Following commands;Orientation Orientation Level: Disoriented to;Situation     Following Commands: Follows one step commands inconsistently            Exercises      General Comments        Pertinent Vitals/Pain Faces Pain Scale: No hurt    Home Living                      Prior Function            PT Goals (current goals can now be found in the care plan section) Acute Rehab PT Goals Patient Stated Goal: To go home PT Goal Formulation: With patient/family Potential to Achieve Goals: Good Progress towards PT goals: Progressing toward goals    Frequency  Min 3X/week    PT Plan Current plan remains  appropriate    Co-evaluation             End of Session Equipment Utilized During Treatment: Gait belt Activity Tolerance: Patient tolerated treatment well Patient left: in chair;with call bell/phone within reach;with chair alarm set;with family/visitor present     Time: 1610-9604 PT Time Calculation (min) (ACUTE ONLY): 35 min  Charges:  $Gait Training: 8-22 mins $Therapeutic Activity: 8-22 mins                    G Codes:      Tamala Ser 06/20/2015, 9:55 AM [phone]

## 2015-06-20 NOTE — Discharge Summary (Signed)
Physician Discharge Summary  Bradley Reed ZOX:[id number] DOB: December 09, 1923 DOA: 06/15/2015  PCP: Lubertha South, MD  Admit date: 06/15/2015 Discharge date: 06/20/2015  Time spent: 25* minutes  Recommendations for Outpatient Follow-up:  1. 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 He also had a temp of 104, and CXR bilateral airspace disease, so he was given Rocephin and Zithromax IV. RN said he has been more confused yesterday and today, and had a rapid decline in his cognition. Stroke was suspected, and MRI with and without contrast was done, but it was negative, showing no evidence of CVA. TSH, B12, and HIV were negative as well. He is very confused in just a couple of days. Patient's chest x-ray showed pneumonia, started on Rocephin and Zithromax. Patient continues to be intermittently confused, worsening of confusion at bedtime. As per family patient does have history of mild dementia.  Hospital Course:  1. Delirium- resolved secondary to community acquired pneumonia, patient was seen by neurology,  they recommend continuing with antibiotic therapy for underlying infection.  2. Community acquired pneumonia- patient was started on ceftriaxone and Zithromax in the hospital, completed 5  days of therapy. Has been afebrile, wbc normal. Will not send home on antibiotics. 3. Dementia- patient has underlying Alzheimer's disease. No behavior disturbance today. 4. Diabetes mellitus- continue metformin 5. Hypertension- will change the lisinopril to 20 mg po daily and start Amlodipine 5 mg po daily.  Procedures:  None  Consultations:  Neurology  Discharge Exam: Filed Vitals:   06/20/15 0615  BP: 154/87  Pulse: 75  Temp: 98.5 F (36.9 C)  Resp: 18    General: Appear in no acute distress Cardiovascular: S1S2 RRR Respiratory: Clear bilaterallly  Discharge Instructions   Discharge Instructions    Diet - low sodium heart healthy    Complete by:  As directed      Diet - low sodium heart healthy    Complete by:  As directed      Increase activity slowly    Complete by:  As directed      Increase activity slowly    Complete by:  As directed           Current Discharge Medication List    START taking these medications   Details  amLODipine (NORVASC) 5 MG tablet Take 1 tablet (5 mg total) by mouth daily. Qty: 30 tablet, Refills: 0      CONTINUE these medications which have CHANGED   Details  lisinopril (PRINIVIL,ZESTRIL) 40 MG tablet Take 0.5 tablets (20 mg total) by mouth every evening. Qty: 30 tablet, Refills: 0      CONTINUE these medications which have NOT CHANGED   Details  aspirin EC 81 MG tablet Take  81 mg by mouth daily.    Calcium Carbonate-Vitamin D (CALCIUM 600+D) 600-400 MG-UNIT per tablet Take 1 tablet by mouth 2 (two) times daily.      fexofenadine (ALLEGRA) 180 MG tablet Take 180 mg by mouth daily.     Glucosamine HCl 1500 MG TABS Take 1,500 mg by mouth daily.    metFORMIN (GLUCOPHAGE) 500 MG tablet TAKE 1 TABLET BY MOUTH TWICE DAILY WITH MEALS. Qty: 60 tablet, Refills: 3    Multiple Vitamins-Minerals (VITEYES AREDS FORMULA PO) Take 1 tablet by mouth 2 (two) times daily.     pravastatin (PRAVACHOL) 40 MG tablet Take 40 mg by mouth  every evening.    rivastigmine (EXELON) 4.6 mg/24hr Place 4.6 mg onto the skin daily.    senna (SENOKOT) 8.6 MG TABS Take 1 tablet by mouth daily as needed (constipation).     SIMBRINZA 1-0.2 % SUSP Place 1 drop into both eyes 2 (two) times daily.    vitamin B-12 (CYANOCOBALAMIN) 100 MCG tablet Take 100 mcg by mouth 2 (two) times daily.      vitamin C (ASCORBIC ACID) 500 MG tablet Take 500 mg by mouth daily.       Allergies  Allergen Reactions  . Ciprofloxacin   . Bactrim Other (See Comments)    Shaking, patient doesn't recall this allergy       The results of significant diagnostics from this hospitalization (including imaging, microbiology, ancillary and laboratory) are listed below for reference.    Significant Diagnostic Studies: Dg Chest 2 View  06/15/2015   CLINICAL DATA:  Acute onset of fever and cough. Generalized weakness and fatigue. Initial encounter.  EXAM: CHEST  2 VIEW  COMPARISON:  Chest radiograph performed 08/21/2014  FINDINGS: The lungs are well-aerated. Bibasilar airspace opacities may reflect mild interstitial edema. Vascular congestion is noted. There is no evidence of pleural effusion or pneumothorax.  The heart is normal in size; the mediastinal contour is within normal limits. No acute osseous abnormalities are seen.  IMPRESSION: Bibasilar airspace opacities may reflect mild interstitial edema. Vascular congestion noted. Pneumonia might have a similar appearance.   Electronically Signed   By: Roanna Raider M.D.   On: 06/15/2015 22:10   Mr Laqueta Jean GU Contrast  06/16/2015   CLINICAL DATA:  81 year old male with altered mental status. Confusion. Dysphagia. Initial encounter.  EXAM: MRI HEAD WITHOUT AND WITH CONTRAST  TECHNIQUE: Multiplanar, multiecho pulse sequences of the brain and surrounding structures were obtained without and with intravenous contrast.  CONTRAST:  14mL MULTIHANCE GADOBENATE DIMEGLUMINE 529 MG/ML IV SOLN  COMPARISON:  Head CT without contrast  03/21/2013. Brain MRI 03/19/2011.  FINDINGS: Study is intermittently degraded by motion artifact despite repeated imaging attempts.  Major intracranial vascular flow voids are stable, with dominant left vertebral artery Re identified. Cerebral volume has not significantly changed since 2012 and is within normal limits for age. No restricted diffusion to suggest acute infarction. No midline shift, mass effect, evidence of mass lesion, ventriculomegaly, extra-axial collection or acute intracranial hemorrhage. Cervicomedullary junction and pituitary are within normal limits.  Stable mild for age nonspecific and scattered cerebral white matter T2 and FLAIR hyperintensity. No cortical encephalomalacia or chronic cerebral blood products identified. No abnormal enhancement identified.  Grossly normal visualized internal auditory structures. Visualized paranasal sinuses and mastoids are clear. Stable orbit and scalp soft tissues. Stable visualized cervical spine.  IMPRESSION: No acute intracranial abnormality.  Stable MRI appearance of the brain since 2012.   Electronically Signed   By: Rexene Edison  Margo Aye M.D.   On: 06/16/2015 19:08    Microbiology: Recent Results (from the past 240 hour(s))  Urine culture     Status: None   Collection Time: 06/15/15 11:44 PM  Result Value Ref Range Status   Specimen Description URINE, CLEAN CATCH  Final   Special Requests NONE  Final   Culture   Final    NO GROWTH 2 DAYS Performed at [facility]    Report Status 06/18/2015 FINAL  Final  Culture, blood (routine x 2) Call MD if unable to obtain prior to antibiotics being given     Status: None (Preliminary result)   Collection Time: 06/16/15  4:07 AM  Result Value Ref Range Status   Specimen Description BLOOD RIGHT ANTECUBITAL  Final   Special Requests BOTTLES DRAWN AEROBIC AND ANAEROBIC 6CC  Final   Culture NO GROWTH 3 DAYS  Final   Report Status PENDING  Incomplete  Culture, blood (routine x 2) Call MD if unable to  obtain prior to antibiotics being given     Status: None (Preliminary result)   Collection Time: 06/16/15  4:11 AM  Result Value Ref Range Status   Specimen Description BLOOD LEFT HAND  Final   Special Requests BOTTLES DRAWN AEROBIC AND ANAEROBIC 6CC BOTTLES  Final   Culture NO GROWTH 3 DAYS  Final   Report Status PENDING  Incomplete     Labs: Basic Metabolic Panel:  Recent Labs Lab 06/15/15 2222 06/16/15 0407  NA 134* 136  K 3.7 3.5  CL 101 103  CO2 23 24  GLUCOSE 162* 144*  BUN 20 19  CREATININE 1.21 1.17  CALCIUM 9.3 9.4   Liver Function Tests:  Recent Labs Lab 06/15/15 2222  AST 19  ALT 13*  ALKPHOS 72  BILITOT 0.2*  PROT 7.6  ALBUMIN 3.8   No results for input(s): LIPASE, AMYLASE in the last 168 hours. No results for input(s): AMMONIA in the last 168 hours. CBC:  Recent Labs Lab 06/15/15 2222 06/16/15 0407  WBC 8.2 8.7  NEUTROABS 5.6 5.6  HGB 13.6 14.2  HCT 39.5 41.1  MCV 96.8 97.2  PLT 195 193   Cardiac Enzymes:  Recent Labs Lab 06/15/15 2222  TROPONINI <0.03   BNP: BNP (last 3 results) No results for input(s): BNP in the last 8760 hours.  ProBNP (last 3 results) No results for input(s): PROBNP in the last 8760 hours.  CBG:  Recent Labs Lab 06/19/15 0842 06/19/15 1201 06/19/15 1632 06/19/15 2033 06/20/15 0808  GLUCAP 157* 170* 150* 167* 145*       Signed:  Nichlas Pitera S  [facility] 06/20/2015, 11:11 AM

## 2015-06-20 NOTE — Progress Notes (Signed)
Patient Discharge: Disposition: Patient discharged to home with home health. Education: Patient and family educated on medications, prescriptions, discharge instructions, follow up appointments and also on Pneumonia, understood and acknowledged. IV: Discontinued before discharge. Telemetry: N/A Transportation: Patient transported in w/c out of the unit accompanied by the staff and family. Belongings: Patient took all his belongings with him.

## 2015-06-20 NOTE — Care Management Note (Signed)
Case Management Note  Patient Details  Name: Bradley Reed MRN: [id number] Date of Birth: November 24, 1923  Subjective/Objective:      CAP              Action/Plan: Home Health  Expected Discharge Date:  06/20/2015               Expected Discharge Plan:  Home w Home Health Services  In-House Referral:  NA  Discharge planning Services  CM Consult  Post Acute Care Choice:  Home Health Choice offered to:  Adult Children   HH Arranged:  PT, Nurse's Aide HH Agency:  [facility]  Status of Service:  Completed, signed off  Medicare Important Message Given:  Yes-second notification given Date Medicare IM Given:    Medicare IM give by:    Date Additional Medicare IM Given:    Additional Medicare Important Message give by:     If discussed at Long Length of Stay Meetings, dates discussed:    Additional Comments: NCM spoke to pt and gave permission to speak to dtr, [place], # [phone], son [phone]. Pt lives at home with wife. Offered choice for [facility]. Requesting [facility] for HH. Contacted [facility] with new referral. Pt has RW and 3n1 at home. Dtr at home to assist pt as needed.  Elliot Cousin, RN 06/20/2015, 11:24 AM

## 2015-06-20 NOTE — Progress Notes (Signed)
Speech Language Pathology Treatment: Dysphagia  Patient Details Name: Bradley Reed MRN: [id number] DOB: 1924-01-04 Today's Date: 06/20/2015 Time: 8280-0349 SLP Time Calculation (min) (ACUTE ONLY): 21 min  Assessment / Plan / Recommendation Clinical Impression  Pt with improved alertness; per dtr, not quite at baseline cognitively, but improved since admission.  Pt observed consuming regular solid foods and water with adequate mastication, brisk swallow, no cough, no concerns for aspiration.  Dtr familiar with progression of dementia and potential impact on eating/swallowing.  Today, Bradley Reed is eating well and safely.  No SLP f/u needed - he is being D/Cd home today.    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 He also had a temp of 104, and CXR bilateral airspace disease, so he was given Rocephin and Zithromax IV. RN said he has been more confused yesterday and today, and had a rapid decline in his cognition. Stroke was suspected, and MRI with and without contrast was done, but it was negative, showing no evidence of CVA. TSH, B12, and HIV were negative as well. He is very confused in just a couple of days. He had trouble getting out of his bed. No HA, Vx problem, or obvious facial droop. No nausea, vomiting, coughs, fever or chills at this time. MRI was negative. SLP asked to evaluate swallow due to decreased po intake, PNA, and cogntive changes.  [facility] transferred to [facility] for neurology consult and SLP following up on toleration.    Pertinent Vitals Pain Assessment: No/denies pain Faces Pain Scale: No hurt  SLP Plan  All goals met    Recommendations Diet recommendations:  Regular;Thin liquid Liquids provided via: Cup Medication Administration: Whole meds with puree Supervision: Patient able to self feed;Intermittent supervision to cue for compensatory strategies Compensations: Slow rate;Small sips/bites Postural Changes and/or Swallow Maneuvers: Seated upright 90 degrees              Oral Care Recommendations: Oral care BID Follow up Recommendations: 24 hour supervision/assistance Plan: All goals met    GO     Juan Quam Laurice 06/20/2015, 10:18 AM

## 2015-06-21 LAB — CULTURE, BLOOD (ROUTINE X 2)
Culture: NO GROWTH
Culture: NO GROWTH

## 2015-06-23 ENCOUNTER — Encounter (HOSPITAL_COMMUNITY): Payer: Self-pay | Admitting: Cardiology

## 2015-06-23 ENCOUNTER — Inpatient Hospital Stay (HOSPITAL_COMMUNITY)
Admission: EM | Admit: 2015-06-23 | Discharge: 2015-06-23 | DRG: 871 | Disposition: A | Discharge status: Hospice | Payer: Medicare Other | Attending: Internal Medicine | Admitting: Internal Medicine

## 2015-06-23 ENCOUNTER — Emergency Department (HOSPITAL_COMMUNITY): Payer: Medicare Other

## 2015-06-23 ENCOUNTER — Telehealth: Payer: Self-pay | Admitting: Family Medicine

## 2015-06-23 DIAGNOSIS — E119 Type 2 diabetes mellitus without complications: Secondary | ICD-10-CM

## 2015-06-23 DIAGNOSIS — Z8249 Family history of ischemic heart disease and other diseases of the circulatory system: Secondary | ICD-10-CM

## 2015-06-23 DIAGNOSIS — G309 Alzheimer's disease, unspecified: Secondary | ICD-10-CM | POA: Diagnosis present

## 2015-06-23 DIAGNOSIS — H3530 Unspecified macular degeneration: Secondary | ICD-10-CM | POA: Diagnosis present

## 2015-06-23 DIAGNOSIS — G9340 Encephalopathy, unspecified: Secondary | ICD-10-CM | POA: Diagnosis not present

## 2015-06-23 DIAGNOSIS — Z515 Encounter for palliative care: Secondary | ICD-10-CM | POA: Insufficient documentation

## 2015-06-23 DIAGNOSIS — A419 Sepsis, unspecified organism: Secondary | ICD-10-CM | POA: Diagnosis present

## 2015-06-23 DIAGNOSIS — E780 Pure hypercholesterolemia: Secondary | ICD-10-CM | POA: Diagnosis present

## 2015-06-23 DIAGNOSIS — F0280 Dementia in other diseases classified elsewhere without behavioral disturbance: Secondary | ICD-10-CM | POA: Diagnosis present

## 2015-06-23 DIAGNOSIS — J9601 Acute respiratory failure with hypoxia: Secondary | ICD-10-CM | POA: Diagnosis not present

## 2015-06-23 DIAGNOSIS — G40209 Localization-related (focal) (partial) symptomatic epilepsy and epileptic syndromes with complex partial seizures, not intractable, without status epilepticus: Secondary | ICD-10-CM | POA: Diagnosis present

## 2015-06-23 DIAGNOSIS — Z87891 Personal history of nicotine dependence: Secondary | ICD-10-CM | POA: Diagnosis not present

## 2015-06-23 DIAGNOSIS — Z823 Family history of stroke: Secondary | ICD-10-CM | POA: Diagnosis not present

## 2015-06-23 DIAGNOSIS — R569 Unspecified convulsions: Secondary | ICD-10-CM | POA: Diagnosis not present

## 2015-06-23 DIAGNOSIS — Z66 Do not resuscitate: Secondary | ICD-10-CM | POA: Diagnosis present

## 2015-06-23 DIAGNOSIS — I10 Essential (primary) hypertension: Secondary | ICD-10-CM | POA: Diagnosis present

## 2015-06-23 DIAGNOSIS — H409 Unspecified glaucoma: Secondary | ICD-10-CM | POA: Diagnosis present

## 2015-06-23 DIAGNOSIS — R4182 Altered mental status, unspecified: Secondary | ICD-10-CM | POA: Diagnosis not present

## 2015-06-23 DIAGNOSIS — F0390 Unspecified dementia without behavioral disturbance: Secondary | ICD-10-CM

## 2015-06-23 DIAGNOSIS — F03A0 Unspecified dementia, mild, without behavioral disturbance, psychotic disturbance, mood disturbance, and anxiety: Secondary | ICD-10-CM | POA: Diagnosis present

## 2015-06-23 HISTORY — DX: Pneumonia, unspecified organism: J18.9

## 2015-06-23 LAB — DIFFERENTIAL
BASOS PCT: 0 % (ref 0–1)
Basophils Absolute: 0.1 10*3/uL (ref 0.0–0.1)
EOS PCT: 2 % (ref 0–5)
Eosinophils Absolute: 0.2 10*3/uL (ref 0.0–0.7)
Lymphocytes Relative: 11 % — ABNORMAL LOW (ref 12–46)
Lymphs Abs: 1.3 10*3/uL (ref 0.7–4.0)
MONO ABS: 1.2 10*3/uL — AB (ref 0.1–1.0)
MONOS PCT: 10 % (ref 3–12)
NEUTROS ABS: 9 10*3/uL — AB (ref 1.7–7.7)
Neutrophils Relative %: 77 % (ref 43–77)

## 2015-06-23 LAB — CBC
HCT: 45.3 % (ref 39.0–52.0)
Hemoglobin: 15.3 g/dL (ref 13.0–17.0)
MCH: 32.9 pg (ref 26.0–34.0)
MCHC: 33.8 g/dL (ref 30.0–36.0)
MCV: 97.4 fL (ref 78.0–100.0)
PLATELETS: 294 10*3/uL (ref 150–400)
RBC: 4.65 MIL/uL (ref 4.22–5.81)
RDW: 12.9 % (ref 11.5–15.5)
WBC: 11.8 10*3/uL — AB (ref 4.0–10.5)

## 2015-06-23 LAB — PROTIME-INR
INR: 1.16 (ref 0.00–1.49)
PROTHROMBIN TIME: 14.9 s (ref 11.6–15.2)

## 2015-06-23 LAB — COMPREHENSIVE METABOLIC PANEL
ALK PHOS: 75 U/L (ref 38–126)
ALT: 29 U/L (ref 17–63)
ANION GAP: 10 (ref 5–15)
AST: 37 U/L (ref 15–41)
Albumin: 3.9 g/dL (ref 3.5–5.0)
BUN: 24 mg/dL — ABNORMAL HIGH (ref 6–20)
CALCIUM: 9.4 mg/dL (ref 8.9–10.3)
CHLORIDE: 98 mmol/L — AB (ref 101–111)
CO2: 28 mmol/L (ref 22–32)
CREATININE: 1.24 mg/dL (ref 0.61–1.24)
GFR calc non Af Amer: 50 mL/min — ABNORMAL LOW (ref >60)
GFR, EST AFRICAN AMERICAN: 58 mL/min — AB (ref >60)
Glucose, Bld: 206 mg/dL — ABNORMAL HIGH (ref 65–99)
Potassium: 4.3 mmol/L (ref 3.5–5.1)
SODIUM: 136 mmol/L (ref 135–145)
Total Bilirubin: 0.7 mg/dL (ref 0.3–1.2)
Total Protein: 8.3 g/dL — ABNORMAL HIGH (ref 6.5–8.1)

## 2015-06-23 LAB — URINE MICROSCOPIC-ADD ON

## 2015-06-23 LAB — RAPID URINE DRUG SCREEN, HOSP PERFORMED
Amphetamines: NOT DETECTED
Barbiturates: NOT DETECTED
Benzodiazepines: NOT DETECTED
Cocaine: NOT DETECTED
OPIATES: NOT DETECTED
Tetrahydrocannabinol: NOT DETECTED

## 2015-06-23 LAB — I-STAT TROPONIN, ED: Troponin i, poc: 0.03 ng/mL (ref 0.00–0.08)

## 2015-06-23 LAB — I-STAT CHEM 8, ED
BUN: 25 mg/dL — ABNORMAL HIGH (ref 6–20)
CHLORIDE: 98 mmol/L — AB (ref 101–111)
Calcium, Ion: 1.26 mmol/L (ref 1.13–1.30)
Creatinine, Ser: 1.1 mg/dL (ref 0.61–1.24)
Glucose, Bld: 214 mg/dL — ABNORMAL HIGH (ref 65–99)
HEMATOCRIT: 48 % (ref 39.0–52.0)
Hemoglobin: 16.3 g/dL (ref 13.0–17.0)
Potassium: 4.2 mmol/L (ref 3.5–5.1)
SODIUM: 138 mmol/L (ref 135–145)
TCO2: 27 mmol/L (ref 0–100)

## 2015-06-23 LAB — URINALYSIS, ROUTINE W REFLEX MICROSCOPIC
Bilirubin Urine: NEGATIVE
Glucose, UA: 100 mg/dL — AB
Ketones, ur: NEGATIVE mg/dL
LEUKOCYTES UA: NEGATIVE
Nitrite: NEGATIVE
PROTEIN: 30 mg/dL — AB
Specific Gravity, Urine: 1.02 (ref 1.005–1.030)
UROBILINOGEN UA: 0.2 mg/dL (ref 0.0–1.0)
pH: 6.5 (ref 5.0–8.0)

## 2015-06-23 LAB — CBG MONITORING, ED: GLUCOSE-CAPILLARY: 207 mg/dL — AB (ref 65–99)

## 2015-06-23 LAB — APTT: aPTT: 29 seconds (ref 24–37)

## 2015-06-23 LAB — I-STAT CG4 LACTIC ACID, ED: Lactic Acid, Venous: 2.56 mmol/L (ref 0.5–2.0)

## 2015-06-23 LAB — ETHANOL

## 2015-06-23 MED ORDER — ACETAMINOPHEN 160 MG/5ML PO LIQD: 500.0000 mg | ORAL | Status: AC | PRN

## 2015-06-23 MED ORDER — LORAZEPAM 2 MG/ML IJ SOLN: 1.0000 mg | INTRAMUSCULAR | Status: AC | PRN

## 2015-06-23 MED ORDER — LORAZEPAM 2 MG/ML IJ SOLN
1.0000 mg | Freq: Once | INTRAMUSCULAR | Status: AC
  Administered 2015-06-23: 1 mg via INTRAVENOUS

## 2015-06-23 MED ORDER — DEXTROSE 5 % IV SOLN
2.0000 g | Freq: Once | INTRAVENOUS | Status: DC
  Filled 2015-06-23: qty 2

## 2015-06-23 MED ORDER — LABETALOL HCL 5 MG/ML IV SOLN: 10.0000 mg | INTRAVENOUS | Status: DC | PRN

## 2015-06-23 MED ORDER — PIPERACILLIN-TAZOBACTAM 3.375 G IVPB 30 MIN
3.3750 g | Freq: Once | INTRAVENOUS | Status: AC
  Administered 2015-06-23: 3.375 g via INTRAVENOUS
  Filled 2015-06-23: qty 50

## 2015-06-23 MED ORDER — MORPHINE SULFATE (CONCENTRATE) 10 MG/0.5ML PO SOLN: 2.5000 mg | ORAL | Status: DC | PRN

## 2015-06-23 MED ORDER — PIPERACILLIN-TAZOBACTAM 3.375 G IVPB
3.3750 g | Freq: Three times a day (TID) | INTRAVENOUS | Status: DC
  Filled 2015-06-23 (×3): qty 50

## 2015-06-23 MED ORDER — SODIUM CHLORIDE 0.9 % IV SOLN
500.0000 mg | Freq: Two times a day (BID) | INTRAVENOUS | Status: DC
  Filled 2015-06-23 (×2): qty 5

## 2015-06-23 MED ORDER — LORAZEPAM 2 MG/ML IJ SOLN
INTRAMUSCULAR | Status: AC
  Filled 2015-06-23: qty 1

## 2015-06-23 MED ORDER — DEXTROSE 5 % IV SOLN
2.0000 g | Freq: Two times a day (BID) | INTRAVENOUS | Status: DC
  Filled 2015-06-23: qty 2

## 2015-06-23 MED ORDER — SODIUM CHLORIDE 0.9 % IV SOLN
2.0000 g | Freq: Once | INTRAVENOUS | Status: DC
  Filled 2015-06-23: qty 2000

## 2015-06-23 MED ORDER — ONDANSETRON 4 MG PO TBDP: 4.0000 mg | ORAL_TABLET | Freq: Three times a day (TID) | ORAL | Status: AC | PRN

## 2015-06-23 MED ORDER — INSULIN ASPART 100 UNIT/ML ~~LOC~~ SOLN: 0.0000 [IU] | SUBCUTANEOUS | Status: DC

## 2015-06-23 MED ORDER — ACETAMINOPHEN 325 MG PO TABS: 650.0000 mg | ORAL_TABLET | Freq: Four times a day (QID) | ORAL | Status: DC | PRN

## 2015-06-23 MED ORDER — SODIUM CHLORIDE 0.9 % IV BOLUS (SEPSIS): 1000.0000 mL | INTRAVENOUS | Status: AC

## 2015-06-23 MED ORDER — DEXAMETHASONE SODIUM PHOSPHATE 4 MG/ML IJ SOLN: 10.0000 mg | Freq: Once | INTRAMUSCULAR | Status: DC

## 2015-06-23 MED ORDER — ONDANSETRON HCL 4 MG PO TABS: 4.0000 mg | ORAL_TABLET | Freq: Four times a day (QID) | ORAL | Status: DC | PRN

## 2015-06-23 MED ORDER — MORPHINE SULFATE 1 MG/ML IV SOLN: 1.0000 mg | INTRAVENOUS | Status: AC | PRN

## 2015-06-23 MED ORDER — LABETALOL HCL 5 MG/ML IV SOLN
20.0000 mg | Freq: Once | INTRAVENOUS | Status: AC
  Administered 2015-06-23: 20 mg via INTRAVENOUS
  Filled 2015-06-23: qty 4

## 2015-06-23 MED ORDER — VANCOMYCIN HCL IN DEXTROSE 1-5 GM/200ML-% IV SOLN
1000.0000 mg | Freq: Once | INTRAVENOUS | Status: AC
  Administered 2015-06-23: 1000 mg via INTRAVENOUS
  Filled 2015-06-23: qty 200

## 2015-06-23 MED ORDER — ACETAMINOPHEN 650 MG RE SUPP: 650.0000 mg | Freq: Four times a day (QID) | RECTAL | Status: DC | PRN

## 2015-06-23 MED ORDER — SODIUM CHLORIDE 0.9 % IV BOLUS (SEPSIS)
500.0000 mL | Freq: Once | INTRAVENOUS | Status: AC
  Administered 2015-06-23: 500 mL via INTRAVENOUS

## 2015-06-23 MED ORDER — ACYCLOVIR SODIUM 50 MG/ML IV SOLN
10.0000 mg/kg | Freq: Three times a day (TID) | INTRAVENOUS | Status: DC
Start: 2015-06-23 — End: 2015-06-23
  Administered 2015-06-23: 655 mg via INTRAVENOUS
  Filled 2015-06-23 (×3): qty 13.1

## 2015-06-23 MED ORDER — VANCOMYCIN HCL IN DEXTROSE 1-5 GM/200ML-% IV SOLN: 1000.0000 mg | Freq: Once | INTRAVENOUS | Status: DC

## 2015-06-23 MED ORDER — ONDANSETRON HCL 4 MG/2ML IJ SOLN: 4.0000 mg | Freq: Four times a day (QID) | INTRAMUSCULAR | Status: DC | PRN

## 2015-06-23 MED ORDER — VANCOMYCIN HCL IN DEXTROSE 750-5 MG/150ML-% IV SOLN
750.0000 mg | Freq: Two times a day (BID) | INTRAVENOUS | Status: DC
  Filled 2015-06-23 (×2): qty 150

## 2015-06-23 NOTE — Consult Note (Signed)
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  His wife, Claris Che, daughters [place] and Darl Pikes, son Onalee Hua, and son in Social worker.  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  They states over the last year he has been unable to do the things that give him pleasure, and has had issues with memory and speech.  He has just come home from [facility] 3 days ago with a diagnosis of PNE.   Family state that Dr. Julien Reed said "the only way he wanted to go back to the Hospital was in a box."  We talk about comfort measures and family agrees to change venti mask to nasal cannula for comfort.  We also talk about the benefits of Morphine.  Dr. Julien Reed is opioid naive and we will start with a low dose.  We talk abut Hospice benefits and family states this is their goal and they prefer for Dr. Julien Reed to be transferred as soon as possible.   Contacts/Participants in Discussion: Primary Decision Maker: Dr. Julien Reed' family is in agreement regarding his direction of care.     Code Status/Advance Care Planning:  DNR  Symptom Management:   Morphine 2.5 mg PO/SL Q 2 hours PRN. Please hold for RR 12 or less.   Psycho-social/Spiritual:   Support System: Family at bedside.   Desire for further Chaplaincy support:Late referral   Prognosis: Hours - Days  Discharge Planning:  Hospice facility       Chief Complaint: Non responsive History of Present Illness:    Bradley Reed is a 81 y.o. male presenting with altered mental status. The history is provided by his  daughter. The history is limited by the condition of the patient.  Pt was recently d/c from hospital after being treated for PNA. Daughter reports that yesterday he was doing well. He has been handling his own medications and as far as she recalls the only medications he was given at past hospitalization was antibiotics for his pneumonia. He complained of neck pain that was chronic. Denies diarrhea, fever, history of seizure, any new neck pains or headaches. Is ambulatory with cane.  His daughter reports that at approximately 4:00 a.m. 8/15, she noted that he was very restless and increasingly confused. She tried to settle him by placing a damp cloth on his forehead. This morning, he was still lethargic, was not answering questions. He opened his eyes but was unable to talk. She felt that he may have had some "shivering", which lasted a few seconds. EMS was called and pt was brought to the hospital for eval. Upon arrival to ED, he was noted to be febrile, hypertensive. He was noted to have possible jerking movements and his head/eyes were deviated to the left. He received Ativan with resolution of seizure. He has been lethargic since then. Daughter reports that he did not complain of any recent headache, new neck pain, dysuria, n/v/d or SOB. He has been referred for admission.  Primary Diagnoses  Present on Admission:  . Acute encephalopathy . Acute respiratory failure with hypoxia . Sepsis . HTN (hypertension) .  Mild dementia  Palliative Review of Systems: Dr. Julien Reed is unable to participate in ROS.   I have reviewed the medical record, interviewed the patient and family, and examined the patient. The following aspects are pertinent.  Past Medical History  Diagnosis Date  . Diabetes mellitus   . Hypertension   . Hypercholesteremia   . Glaucoma   . Diverticulosis   . Prostatism   . Reflux   . Tremor   . Other general symptoms(780.99)   . Other vitamin B12 deficiency anemia     . Essential and other specified forms of tremor   . Abnormality of gait   . Memory loss   . Hearing difficulty of both ears     Bilateral hearing aids  . Macular degeneration   . Pneumonia    Social History   Social History  . Marital Status: Married    Spouse Name: N/A  . Number of Children: 3  . Years of Education: PHD   Occupational History  . Teacher     Retired   Social History Main Topics  . Smoking status: Former Smoker    Types: Cigarettes    Quit date: 03/17/1963  . Smokeless tobacco: Never Used  . Alcohol Use: No  . Drug Use: No  . Sexual Activity: Not Currently   Other Topics Concern  . None   Social History Narrative   Patient is married and lives with his wife. Patient worked as a Runner, broadcasting/film/video for [employer].   Patient has his PHD degree.   Family History  Problem Relation Age of Onset  . Stroke Mother   . Heart failure Father   . Heart disease Brother   . Arthritis Brother    Scheduled Meds: . acyclovir  10 mg/kg Intravenous Q8H  . ampicillin (OMNIPEN) IV  2 g Intravenous Once  . cefTRIAXone (ROCEPHIN)  IV  2 g Intravenous Once  . dexamethasone  10 mg Intravenous Once  . insulin aspart  0-9 Units Subcutaneous 6 times per day  . levETIRAcetam  500 mg Intravenous Q12H  . sodium chloride  1,000 mL Intravenous Q1H  . vancomycin  1,000 mg Intravenous Once  . vancomycin  750 mg Intravenous Q12H   Continuous Infusions:  PRN Meds:.acetaminophen **OR** acetaminophen, labetalol, ondansetron **OR** ondansetron (ZOFRAN) IV Medications Prior to Admission:  Prior to Admission medications   Medication Sig Start Date End Date Taking? Authorizing Provider  amLODipine (NORVASC) 5 MG tablet Take 1 tablet (5 mg total) by mouth daily. 06/20/15  Yes Meredeth Ide, MD  aspirin EC 81 MG tablet Take 81 mg by mouth daily.   Yes Historical Provider, MD  Calcium Carbonate-Vitamin D (CALCIUM 600+D) 600-400 MG-UNIT per tablet Take 1 tablet by mouth 2 (two)  times daily.     Yes Historical Provider, MD  fexofenadine (ALLEGRA) 180 MG tablet Take 180 mg by mouth daily.    Yes Historical Provider, MD  Glucosamine HCl 1500 MG TABS Take 1,500 mg by mouth daily.   Yes Historical Provider, MD  metFORMIN (GLUCOPHAGE) 500 MG tablet TAKE 1 TABLET BY MOUTH TWICE DAILY WITH MEALS. 05/01/15  Yes Babs Sciara, MD  Multiple Vitamins-Minerals (VITEYES AREDS FORMULA PO) Take 1 tablet by mouth 2 (two) times daily.    Yes Historical Provider, MD  pravastatin (PRAVACHOL) 40 MG tablet Take 40 mg by mouth every evening.   Yes Historical Provider, MD  senna (SENOKOT) 8.6 MG TABS Take 1 tablet by mouth daily as needed (constipation).  Historical Provider, MD  SIMBRINZA 1-0.2 % SUSP Place 1 drop into both eyes 2 (two) times daily. 08/21/14   Historical Provider, MD  vitamin B-12 (CYANOCOBALAMIN) 100 MCG tablet Take 100 mcg by mouth 2 (two) times daily.      Historical Provider, MD  vitamin C (ASCORBIC ACID) 500 MG tablet Take 500 mg by mouth daily.    Historical Provider, MD   Allergies  Allergen Reactions  . Ciprofloxacin   . Bactrim Other (See Comments)    Shaking, patient doesn't recall this allergy    CBC:    Component Value Date/Time   WBC 11.8* 06/23/2015 0956   HGB 16.3 06/23/2015 1021   HCT 48.0 06/23/2015 1021   PLT 294 06/23/2015 0956   MCV 97.4 06/23/2015 0956   NEUTROABS 9.0* 06/23/2015 0956   LYMPHSABS 1.3 06/23/2015 0956   MONOABS 1.2* 06/23/2015 0956   EOSABS 0.2 06/23/2015 0956   BASOSABS 0.1 06/23/2015 0956   Comprehensive Metabolic Panel:    Component Value Date/Time   NA 138 06/23/2015 1021   NA 139 02/14/2015 0823   K 4.2 06/23/2015 1021   CL 98* 06/23/2015 1021   CO2 28 06/23/2015 0956   BUN 25* 06/23/2015 1021   BUN 25 02/14/2015 0823   CREATININE 1.10 06/23/2015 1021   CREATININE 1.28 01/08/2014 0942   GLUCOSE 214* 06/23/2015 1021   GLUCOSE 130* 02/14/2015 0823   CALCIUM 9.4 06/23/2015 0956   AST 37 06/23/2015 0956   ALT  29 06/23/2015 0956   ALKPHOS 75 06/23/2015 0956   BILITOT 0.7 06/23/2015 0956   BILITOT 0.7 02/14/2015 0823   PROT 8.3* 06/23/2015 0956   PROT 7.4 02/14/2015 0823   ALBUMIN 3.9 06/23/2015 0956    Physical Exam: Vital Signs: BP 163/104 mmHg  Pulse 100  Temp(Src) 99.6 F (37.6 C) (Axillary)  Resp 18  Ht 5\' 9"  (1.753 m)  Wt 65.3 kg (143 lb 15.4 oz)  BMI 21.25 kg/m2  SpO2 98% SpO2: SpO2: 98 % O2 Device: O2 Device: Partial Rebreather Mask O2 Flow Rate: O2 Flow Rate (L/min): 15 L/min Intake/output summary: No intake or output data in the 24 hours ending 06/23/15 1646 LBM:   Baseline Weight: Weight: 65.3 kg (143 lb 15.4 oz) Most recent weight: Weight: 65.3 kg (143 lb 15.4 oz)  Exam Findings:  Constitutional:  Lying in bed, does not open eyes, frail, elderly.  Resp: even, some labored breathing, venti mask  GI: Abd soft, no grimace with palpation.          Palliative Performance Scale:             3 weeks ago: 60% Today: 20% at best  Additional Data Reviewed: Recent Labs     06/23/15  0956  06/23/15  1021  WBC  11.8*   --   HGB  15.3  16.3  PLT  294   --   NA  136  138  BUN  24*  25*  CREATININE  1.24  1.10     Time In: 1545 Time Out:  1645 Time Total:  60 minutes  Greater than 50%  of this time was spent counseling and coordinating care related to the above assessment and plan. GOC discussion with nursing staff, Dr. Kerry Hough, and Hospice staff nurse.    Signed by: Katheran Awe, NP  Katheran Awe, NP  06/23/2015, 4:46 PM  Please contact Palliative Medicine Team phone at [phone] for questions and concerns.

## 2015-06-23 NOTE — Telephone Encounter (Signed)
[facility]

## 2015-06-23 NOTE — Discharge Summary (Signed)
Physician Discharge Summary  Bradley Reed ZOX:[id number] DOB: [birth year] DOA: 06/23/2015  PCP: Lubertha South, MD  Admit date: 06/23/2015 Discharge date: 06/23/2015  Time spent: 40 minutes  Recommendations for Outpatient Follow-up:  1. 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 He had come back to the emergency room earlier today after having a worsening in his mental status. He was noted to be mildly febrile and was having seizures in the emergency room. It was felt that he may have underlying sepsis and a possible CNS infection. He was started on intravenous antibiotic. Neurology was consulted who recommended to start the patient on Keppra. Initially, a lumbar puncture was offered to the family to complete infectious workup. After discussing amongst themselves as well as neurology, the family decided to not proceed with lumbar puncture. Palliative care was consulted who discussed the patient's current health, prognosis and what his wishes would be regarding his care. The family has elected to discontinue all aggressive measures and pursue a hospice/palliative approach. The report that he's been declining for a while now and reported that he would not want to return to the hospital after his last discharge. They wish to focus his care towards his quality of life. They feel that he has not been happy for quite some time now and has been  frustrated regarding his limitations. They feel that he would not want to continue with aggressive measures. After meeting with palliative care, hospice was consulted and it appears that there is a residential hospice bed available today. He'll be discharged there later today for end-of-life care. Will use Ativan when necessary for seizures and morphine for pain. Will use Tylenol for fevers. The family is in agreement with this plan.  Procedures:    Consultations:  Neurology  Palliative care  Discharge Exam: Filed Vitals:   06/23/15 1500  BP: 163/104  Pulse:   Temp:   Resp: 18    General: does not answer questions or respond to voice Cardiovascular: s1,s2 rrr Respiratory: cta b  Discharge Instructions   Discharge Instructions    Diet - low sodium heart healthy    Complete by:  As directed      Increase activity slowly    Complete by:  As directed           Current Discharge Medication List    START taking these medications   Details  acetaminophen (TYLENOL) 160 MG/5ML liquid Take 15.6 mLs (500 mg total) by mouth every 4 (four) hours as needed for fever. Qty: 120 mL, Refills: 0    LORazepam (ATIVAN) 2 MG/ML injection Inject 0.5 mLs (1 mg total) into the vein every 4 (four) hours as needed for seizure. Qty: 10 mL, Refills: 0    morphine 1 MG/ML Inject 1-2 mLs (1-2 mg total) into the vein every 2 (two) hours as needed. Qty: 25 mL, Refills: 0    ondansetron (ZOFRAN ODT) 4 MG  disintegrating tablet Take 1 tablet (4 mg total) by mouth every 8 (eight) hours as needed for nausea or vomiting. Qty: 20 tablet, Refills: 0      STOP taking these medications     amLODipine (NORVASC) 5 MG tablet      aspirin EC 81 MG tablet      Calcium Carbonate-Vitamin D (CALCIUM 600+D) 600-400 MG-UNIT per tablet      fexofenadine (ALLEGRA) 180 MG tablet      Glucosamine HCl 1500 MG TABS      metFORMIN (GLUCOPHAGE) 500 MG tablet      Multiple Vitamins-Minerals (VITEYES AREDS  FORMULA PO)      pravastatin (PRAVACHOL) 40 MG tablet      senna (SENOKOT) 8.6 MG TABS      SIMBRINZA 1-0.2 % SUSP      vitamin B-12 (CYANOCOBALAMIN) 100 MCG tablet      vitamin C (ASCORBIC ACID) 500 MG tablet        Allergies  Allergen Reactions  . Ciprofloxacin   . Bactrim Other (See Comments)    Shaking, patient doesn't recall this allergy       The results of significant diagnostics from this hospitalization (including imaging, microbiology, ancillary and laboratory) are listed below for reference.    Significant Diagnostic Studies: Dg Chest 2 View  06/15/2015   CLINICAL DATA:  Acute onset of fever and cough. Generalized weakness and fatigue. Initial encounter.  EXAM: CHEST  2 VIEW  COMPARISON:  Chest radiograph performed 08/21/2014  FINDINGS: The lungs are well-aerated. Bibasilar airspace opacities may reflect mild interstitial edema. Vascular congestion is noted. There is no evidence of pleural effusion or pneumothorax.  The heart is normal in size; the mediastinal contour is within normal limits. No acute osseous abnormalities are seen.  IMPRESSION: Bibasilar airspace opacities may reflect mild interstitial edema. Vascular congestion noted. Pneumonia might have a similar appearance.   Electronically Signed   By: Roanna Raider M.D.   On: 06/15/2015 22:10   Ct Head Wo Contrast  06/23/2015   CLINICAL DATA:  Altered mental status today with hypoxemia. History of hypertension. No known injury. Initial encounter.  EXAM: CT HEAD WITHOUT CONTRAST  TECHNIQUE: Contiguous axial images were obtained from the base of the skull through the vertex without intravenous contrast.  COMPARISON:  Head CT 03/11/2013.  MRI brain 06/16/2015.  FINDINGS: There is no evidence of acute intracranial hemorrhage, mass lesion, brain edema or extra-axial fluid collection. The ventricles and subarachnoid spaces are prominent but stable. There is no CT evidence of acute cortical infarction. There is stable mild  chronic periventricular white matter disease. Intracranial vascular calcifications noted.  Mild ethmoid sinus mucosal thickening. The visualized paranasal sinuses, mastoid air cells and middle ears are otherwise clear. The calvarium is intact.  IMPRESSION: Stable examination demonstrating mild atrophy and chronic small vessel ischemic changes. No acute intracranial findings.   Electronically Signed   By: Carey Bullocks M.D.   On: 06/23/2015 11:08   Mr Laqueta Jean WU Contrast  06/16/2015   CLINICAL DATA:  81 year old male with altered mental status. Confusion. Dysphagia. Initial encounter.  EXAM: MRI HEAD WITHOUT AND WITH CONTRAST  TECHNIQUE: Multiplanar, multiecho pulse sequences of the brain and surrounding structures were obtained without and with intravenous contrast.  CONTRAST:  14mL MULTIHANCE GADOBENATE DIMEGLUMINE 529 MG/ML IV SOLN  COMPARISON:  Head CT without contrast 03/21/2013. Brain MRI 03/19/2011.  FINDINGS: Study is intermittently degraded by motion artifact despite repeated imaging attempts.  Major intracranial vascular flow voids  are stable, with dominant left vertebral artery Re identified. Cerebral volume has not significantly changed since 2012 and is within normal limits for age. No restricted diffusion to suggest acute infarction. No midline shift, mass effect, evidence of mass lesion, ventriculomegaly, extra-axial collection or acute intracranial hemorrhage. Cervicomedullary junction and pituitary are within normal limits.  Stable mild for age nonspecific and scattered cerebral white matter T2 and FLAIR hyperintensity. No cortical encephalomalacia or chronic cerebral blood products identified. No abnormal enhancement identified.  Grossly normal visualized internal auditory structures. Visualized paranasal sinuses and mastoids are clear. Stable orbit and scalp soft tissues. Stable visualized cervical spine.  IMPRESSION: No acute intracranial abnormality.  Stable MRI appearance of the brain since  2012.   Electronically Signed   By: Odessa Fleming M.D.   On: 06/16/2015 19:08   Dg Chest Portable 1 View  06/23/2015   CLINICAL DATA:  Unresponsive this morning.  EXAM: PORTABLE CHEST - 1 VIEW  COMPARISON:  06/15/2015  FINDINGS: There are low lung volumes with crowding of the interstitial markings. There is no focal parenchymal opacity. There is no pleural effusion or pneumothorax. The heart and mediastinal contours are unremarkable. There is thoracic aortic atherosclerosis.  There is calcification adjacent to right greater tuberosity concerning for mild calcific tendinosis.  IMPRESSION: No active disease.   Electronically Signed   By: Elige Ko   On: 06/23/2015 10:17    Microbiology: Recent Results (from the past 240 hour(s))  Urine culture     Status: None   Collection Time: 06/15/15 11:44 PM  Result Value Ref Range Status   Specimen Description URINE, CLEAN CATCH  Final   Special Requests NONE  Final   Culture   Final    NO GROWTH 2 DAYS Performed at [facility]    Report Status 06/18/2015 FINAL  Final  Culture, blood (routine x 2) Call MD if unable to obtain prior to antibiotics being given     Status: None   Collection Time: 06/16/15  4:07 AM  Result Value Ref Range Status   Specimen Description BLOOD RIGHT ANTECUBITAL  Final   Special Requests BOTTLES DRAWN AEROBIC AND ANAEROBIC 6CC  Final   Culture NO GROWTH 5 DAYS  Final   Report Status 06/21/2015 FINAL  Final  Culture, blood (routine x 2) Call MD if unable to obtain prior to antibiotics being given     Status: None   Collection Time: 06/16/15  4:11 AM  Result Value Ref Range Status   Specimen Description BLOOD LEFT HAND  Final   Special Requests BOTTLES DRAWN AEROBIC AND ANAEROBIC 6CC BOTTLES  Final   Culture NO GROWTH 5 DAYS  Final   Report Status 06/21/2015 FINAL  Final     Labs: Basic Metabolic Panel:  Recent Labs Lab 06/23/15 0956 06/23/15 1021  NA 136 138  K 4.3 4.2  CL 98* 98*  CO2 28  --   GLUCOSE  206* 214*  BUN 24* 25*  CREATININE 1.24 1.10  CALCIUM 9.4  --    Liver Function Tests:  Recent Labs Lab 06/23/15 0956  AST 37  ALT 29  ALKPHOS 75  BILITOT 0.7  PROT 8.3*  ALBUMIN 3.9   No results for input(s): LIPASE, AMYLASE in the last 168 hours. No results for input(s): AMMONIA in the last 168 hours. CBC:  Recent Labs Lab 06/23/15 0956 06/23/15 1021  WBC 11.8*  --   NEUTROABS 9.0*  --   HGB 15.3 16.3  HCT 45.3 48.0  MCV 97.4  --   PLT 294  --    Cardiac Enzymes: No results for input(s): CKTOTAL, CKMB, CKMBINDEX, TROPONINI in the last 168 hours. BNP: BNP (last 3 results) No results for input(s): BNP in the last 8760 hours.  ProBNP (last 3 results) No results for input(s): PROBNP in the last 8760 hours.  CBG:  Recent Labs Lab 06/19/15 1632 06/19/15 2033 06/20/15 0808 06/20/15 1157 06/23/15 1002  GLUCAP 150* 167* 145* 167* 207*       Signed:  MEMON,JEHANZEB  [facility] 06/23/2015, 4:58 PM

## 2015-06-23 NOTE — Telephone Encounter (Signed)
Do they have medical power of attorney already established that allows a family member to spk for a lovec one? If so, they have legal righ to express thoughtst. If not, the er doc and hospitalixt will confer with the family about their thoughts of best wishes of the patient.This is an extremely fluid situation that now really has to be managed by hospital physicians. It is true that ems does full approach unless the have a DNR form to see at the home

## 2015-06-23 NOTE — Telephone Encounter (Signed)
pts spouse calling to say they are headed to the hospital, she will be signing a DNR When she gets to the hospital. She was not aware a yellow DNR had to be on hand  To keep the EMS from performing life saving measures on this patient. They are going  To [facility].   They just wanted you to know what was going on at this time.

## 2015-06-23 NOTE — Progress Notes (Addendum)
Patient brought to ICU 3 from Dept 300 with RN stating primary MD requesting stepdown bed for seizures and syncope.  Neurologist on unit and spoke with family about the plan of care.  Patient refusing all medications and requesting Hospice at this time.  Primary MD made aware by neurologist and stated he would be by to see family shortly. Patient is currently unresponsive, shallow breathing, mottled skin. MD aware.

## 2015-06-23 NOTE — Progress Notes (Signed)
Patient is transferring to the hospice by EMS. Family is at the bedside. Report was given to the hospice by Thersa Salt, RN.

## 2015-06-23 NOTE — Progress Notes (Signed)
RT note-One flap removed to make a PRB approx 60-80%, continue to monitor for weaning fio2.

## 2015-06-23 NOTE — ED Notes (Signed)
EMS called out for unresponsive.   Recently treated for pneumonia.  Suctioned and placed on oxygen per ems.

## 2015-06-23 NOTE — Telephone Encounter (Signed)
We located his Living Will in our system here an called the ED to let them know, they  Are printing a copy an giving it to the Doc treating this patient at this time.

## 2015-06-23 NOTE — ED Notes (Signed)
MD at bedside. 

## 2015-06-23 NOTE — Telephone Encounter (Signed)
Pt has been admitted to the ICU

## 2015-06-23 NOTE — Telephone Encounter (Signed)
Patients daughter [place] says that they did not realize that a yellow sheet was necessary for a DNR.  [place] says that [employer] talk or communicate to them at all.  [place] wants to know if there is anything that Dr. Brett Canales can do to prevent the hospital from resuscitating him.  She says her dad does not want to be resuscitated at all.

## 2015-06-23 NOTE — Consult Note (Signed)
[place] A. Merlene Laughter, MD     [email or web address]          Bradley Reed is an 81 y.o. male.   ASSESSMENT/PLAN:  1. Multifactorial encephalopathy. The patient appears to have been having complex partial seizures. He also has a low-grade fever which raises the possibility of central nervous system infection including meningitis and encephalitis.  2. Baseline advanced Alzheimer's dementia. 3. Recent pneumonia with encephalopathy.  RECOMMENDATION: I had a long discussion with the family. The patient is a DNR and did not want heroic measure. The patient stated long time ago and repeatedly that he wanted a piece comfortable transition. The family is categorically against invasive procedure such as a spinal tap to evaluate for central nervous system infections. Antibiotics could be continued but there is a risk of organ damage particularly renal failure with a combination of acyclovir and the vancomycin. Consequently, they have opted against any antibiotics and to keep the patient comfortable. I agree with this assessment given his overall condition including age and advanced dementia. I did the encourage them to continue with Keppra for seizures which would minimize combination of the seizures and discomfort. This medication has low risk of compensation and side effects. We will contact hospice care and Palliative Medicine.  The patient is a 81 year old white male who was recently admitted to this hospital last week and was sent to [facility] because of the possibility of stroke. The workup including MRI which showed no acute infarct. The patient has a history of Alzheimer's dementia which has been getting worse. The family reports that he has not had a good year with worsening cognitive impairment, irritability and agitation because he has not been able to do the things that he has done in the past. The patient did have significant acute encephalopathy with the his recent  admission last week. He was diagnosed as having pneumonia as the cause of his unresponsiveness and encephalopathy. This improved although he did not return to baseline. Over the last couple days the patient has become increasingly drowsy and sleeping quite a bit. It appears that he also has had a few episodes of staring and in the eyes open yet been unresponsive. One daughter reported that he had a couple of spells where he was shaking on both sides. They did contact EMS and EMS apparently noted suspicious activity for seizures with eyes open and being unresponsive. In the emergency room patient had a couple events where he had tonic deviation of the eye to the right associated with eyes been open and him being unresponsive. He was given Ativan for these events. The patient has been noted to have a low-grade fever 100.8. Patient has remained unresponsive.  GENERAL: Thin. The patient is on a Ventimask. Breathing rate is increased.  HEENT: Supple. Atraumatic normocephalic.   ABDOMEN: soft  EXTREMITIES: No edema   BACK: Normal.  SKIN: Normal by inspection.    MENTAL STATUS: No eye opening is appreciated even with deep painful stimuli. There is no verbal output. The patient does not follow commands.  CRANIAL NERVES: Pupils are equal, round and reactive to light; extra ocular movements are full - passively, upper and lower facial muscles are normal in strength and symmetric, there is no flattening of the nasolabial folds. Corneal reflexes are intact.  MOTOR: Some semipurposeful movements of the painful stimuli.  COORDINATION: There is no tremors, rigidity or abnormal movements appreciated.  SENSATION: Diminished pain throughout to deep painful stimuli.    Blood pressure 159/90,  pulse 100, temperature 99.6 F (37.6 C), temperature source Axillary, resp. rate 15, height 5' 9"  (1.753 m), weight 65.3 kg (143 lb 15.4 oz), SpO2 97 %.  Past Medical History  Diagnosis Date  . Diabetes mellitus   .  Hypertension   . Hypercholesteremia   . Glaucoma   . Diverticulosis   . Prostatism   . Reflux   . Tremor   . Other general symptoms(780.99)   . Other vitamin B12 deficiency anemia   . Essential and other specified forms of tremor   . Abnormality of gait   . Memory loss   . Hearing difficulty of both ears     Bilateral hearing aids  . Macular degeneration   . Pneumonia     Past Surgical History  Procedure Laterality Date  . Leg surgery    . Tonsillectomy    . Eye surgery      Family History  Problem Relation Age of Onset  . Stroke Mother   . Heart failure Father   . Heart disease Brother   . Arthritis Brother     Social History:  reports that he quit smoking about 52 years ago. His smoking use included Cigarettes. He has never used smokeless tobacco. He reports that he does not drink alcohol or use illicit drugs.  Allergies:  Allergies  Allergen Reactions  . Ciprofloxacin   . Bactrim Other (See Comments)    Shaking, patient doesn't recall this allergy     Medications: Prior to Admission medications   Medication Sig Start Date End Date Taking? Authorizing Provider  amLODipine (NORVASC) 5 MG tablet Take 1 tablet (5 mg total) by mouth daily. 06/20/15  Yes Oswald Hillock, MD  aspirin EC 81 MG tablet Take 81 mg by mouth daily.   Yes Historical Provider, MD  Calcium Carbonate-Vitamin D (CALCIUM 600+D) 600-400 MG-UNIT per tablet Take 1 tablet by mouth 2 (two) times daily.     Yes Historical Provider, MD  fexofenadine (ALLEGRA) 180 MG tablet Take 180 mg by mouth daily.    Yes Historical Provider, MD  Glucosamine HCl 1500 MG TABS Take 1,500 mg by mouth daily.   Yes Historical Provider, MD  metFORMIN (GLUCOPHAGE) 500 MG tablet TAKE 1 TABLET BY MOUTH TWICE DAILY WITH MEALS. 05/01/15  Yes Kathyrn Drown, MD  Multiple Vitamins-Minerals (VITEYES AREDS FORMULA PO) Take 1 tablet by mouth 2 (two) times daily.    Yes Historical Provider, MD  pravastatin (PRAVACHOL) 40 MG tablet Take 40  mg by mouth every evening.   Yes Historical Provider, MD  lisinopril (PRINIVIL,ZESTRIL) 40 MG tablet Take 0.5 tablets (20 mg total) by mouth every evening. Patient not taking: Reported on 06/23/2015 06/20/15   Oswald Hillock, MD  senna (SENOKOT) 8.6 MG TABS Take 1 tablet by mouth daily as needed (constipation).     Historical Provider, MD  SIMBRINZA 1-0.2 % SUSP Place 1 drop into both eyes 2 (two) times daily. 08/21/14   Historical Provider, MD  vitamin B-12 (CYANOCOBALAMIN) 100 MCG tablet Take 100 mcg by mouth 2 (two) times daily.      Historical Provider, MD  vitamin C (ASCORBIC ACID) 500 MG tablet Take 500 mg by mouth daily.    Historical Provider, MD    Scheduled Meds: . piperacillin-tazobactam (ZOSYN)  IV  3.375 g Intravenous Q8H  . vancomycin  750 mg Intravenous Q12H   Continuous Infusions:  PRN Meds:.     Results for orders placed or performed during the hospital encounter  of 06/23/15 (from the past 48 hour(s))  Ethanol     Status: None   Collection Time: 06/23/15  9:56 AM  Result Value Ref Range   Alcohol, Ethyl (B) <5 <5 mg/dL    Comment:        LOWEST DETECTABLE LIMIT FOR SERUM ALCOHOL IS 5 mg/dL FOR MEDICAL PURPOSES ONLY   Protime-INR     Status: None   Collection Time: 06/23/15  9:56 AM  Result Value Ref Range   Prothrombin Time 14.9 11.6 - 15.2 seconds   INR 1.16 0.00 - 1.49  APTT     Status: None   Collection Time: 06/23/15  9:56 AM  Result Value Ref Range   aPTT 29 24 - 37 seconds  CBC     Status: Abnormal   Collection Time: 06/23/15  9:56 AM  Result Value Ref Range   WBC 11.8 (H) 4.0 - 10.5 K/uL   RBC 4.65 4.22 - 5.81 MIL/uL   Hemoglobin 15.3 13.0 - 17.0 g/dL   HCT 45.3 39.0 - 52.0 %   MCV 97.4 78.0 - 100.0 fL   MCH 32.9 26.0 - 34.0 pg   MCHC 33.8 30.0 - 36.0 g/dL   RDW 12.9 11.5 - 15.5 %   Platelets 294 150 - 400 K/uL  Differential     Status: Abnormal   Collection Time: 06/23/15  9:56 AM  Result Value Ref Range   Neutrophils Relative % 77 43 - 77 %     Neutro Abs 9.0 (H) 1.7 - 7.7 K/uL   Lymphocytes Relative 11 (L) 12 - 46 %   Lymphs Abs 1.3 0.7 - 4.0 K/uL   Monocytes Relative 10 3 - 12 %   Monocytes Absolute 1.2 (H) 0.1 - 1.0 K/uL   Eosinophils Relative 2 0 - 5 %   Eosinophils Absolute 0.2 0.0 - 0.7 K/uL   Basophils Relative 0 0 - 1 %   Basophils Absolute 0.1 0.0 - 0.1 K/uL  Comprehensive metabolic panel     Status: Abnormal   Collection Time: 06/23/15  9:56 AM  Result Value Ref Range   Sodium 136 135 - 145 mmol/L   Potassium 4.3 3.5 - 5.1 mmol/L   Chloride 98 (L) 101 - 111 mmol/L   CO2 28 22 - 32 mmol/L   Glucose, Bld 206 (H) 65 - 99 mg/dL   BUN 24 (H) 6 - 20 mg/dL   Creatinine, Ser 1.24 0.61 - 1.24 mg/dL   Calcium 9.4 8.9 - 10.3 mg/dL   Total Protein 8.3 (H) 6.5 - 8.1 g/dL   Albumin 3.9 3.5 - 5.0 g/dL   AST 37 15 - 41 U/L   ALT 29 17 - 63 U/L   Alkaline Phosphatase 75 38 - 126 U/L   Total Bilirubin 0.7 0.3 - 1.2 mg/dL   GFR calc non Af Amer 50 (L) >60 mL/min   GFR calc Af Amer 58 (L) >60 mL/min    Comment: (NOTE) The eGFR has been calculated using the CKD EPI equation. This calculation has not been validated in all clinical situations. eGFR's persistently <60 mL/min signify possible Chronic Kidney Disease.    Anion gap 10 5 - 15  CBG monitoring, ED     Status: Abnormal   Collection Time: 06/23/15 10:02 AM  Result Value Ref Range   Glucose-Capillary 207 (H) 65 - 99 mg/dL  I-stat troponin, ED (not at [facility])     Status: None   Collection Time: 06/23/15 10:19 AM  Result  Value Ref Range   Troponin i, poc 0.03 0.00 - 0.08 ng/mL   Comment 3            Comment: Due to the release kinetics of cTnI, a negative result within the first hours of the onset of symptoms does not rule out myocardial infarction with certainty. If myocardial infarction is still suspected, repeat the test at appropriate intervals.   I-Stat Chem 8, ED  (not at [facility])     Status: Abnormal   Collection Time: 06/23/15 10:21 AM  Result  Value Ref Range   Sodium 138 135 - 145 mmol/L   Potassium 4.2 3.5 - 5.1 mmol/L   Chloride 98 (L) 101 - 111 mmol/L   BUN 25 (H) 6 - 20 mg/dL   Creatinine, Ser 1.10 0.61 - 1.24 mg/dL   Glucose, Bld 214 (H) 65 - 99 mg/dL   Calcium, Ion 1.26 1.13 - 1.30 mmol/L   TCO2 27 0 - 100 mmol/L   Hemoglobin 16.3 13.0 - 17.0 g/dL   HCT 48.0 39.0 - 52.0 %  Urine rapid drug screen (hosp performed)not at [facility]     Status: None   Collection Time: 06/23/15 10:25 AM  Result Value Ref Range   Opiates NONE DETECTED NONE DETECTED   Cocaine NONE DETECTED NONE DETECTED   Benzodiazepines NONE DETECTED NONE DETECTED   Amphetamines NONE DETECTED NONE DETECTED   Tetrahydrocannabinol NONE DETECTED NONE DETECTED   Barbiturates NONE DETECTED NONE DETECTED    Comment:        DRUG SCREEN FOR MEDICAL PURPOSES ONLY.  IF CONFIRMATION IS NEEDED FOR ANY PURPOSE, NOTIFY LAB WITHIN 5 DAYS.        LOWEST DETECTABLE LIMITS FOR URINE DRUG SCREEN Drug Class       Cutoff (ng/mL) Amphetamine      1000 Barbiturate      200 Benzodiazepine   754 Tricyclics       360 Opiates          300 Cocaine          300 THC              50   Urinalysis, Routine w reflex microscopic (not at [facility])     Status: Abnormal   Collection Time: 06/23/15 10:25 AM  Result Value Ref Range   Color, Urine YELLOW YELLOW   APPearance CLEAR CLEAR   Specific Gravity, Urine 1.020 1.005 - 1.030   pH 6.5 5.0 - 8.0   Glucose, UA 100 (A) NEGATIVE mg/dL   Hgb urine dipstick SMALL (A) NEGATIVE   Bilirubin Urine NEGATIVE NEGATIVE   Ketones, ur NEGATIVE NEGATIVE mg/dL   Protein, ur 30 (A) NEGATIVE mg/dL   Urobilinogen, UA 0.2 0.0 - 1.0 mg/dL   Nitrite NEGATIVE NEGATIVE   Leukocytes, UA NEGATIVE NEGATIVE  Urine microscopic-add on     Status: Abnormal   Collection Time: 06/23/15 10:25 AM  Result Value Ref Range   RBC / HPF 3-6 <3 RBC/hpf   Bacteria, UA FEW (A) RARE  I-Stat CG4 Lactic Acid, ED     Status: Abnormal   Collection Time: 06/23/15 10:45 AM    Result Value Ref Range   Lactic Acid, Venous 2.56 (HH) 0.5 - 2.0 mmol/L   Comment NOTIFIED PHYSICIAN     Studies/Results:     Hatley Henegar A. Merlene Laughter, M.D.  Diplomate, Tax adviser of Psychiatry and Neurology ( Neurology). 06/23/2015, 2:40 PM

## 2015-06-23 NOTE — H&P (Signed)
[facility] History and Physical  Bradley Reed ZOX:[id number] DOB: 1924-03-28 DOA: 06/23/2015  Referring physician: Linwood Dibbles, MD PCP: Lubertha South, MD   Chief Complaint: Non responsive    HPI: Bradley Reed is a 81 y.o. male presenting with altered mental status. The history is provided by his daughter. The history is limited by the condition of the patient.   Pt was recently d/c from hospital after being treated for PNA.  Daughter reports that yesterday he was doing well. He has been handling his own medications and as far as she recalls the only medications he was given at past hospitalization was antibiotics for his pneumonia. He complained of neck pain that was chronic. Denies diarrhea, fever, history of seizure, any new neck pains or headaches. Is ambulatory with cane.   His daughter reports that at approximately 4:00 a.m. 8/15, she noted that he was very restless and increasingly confused.  She tried to settle him by placing a damp cloth on his forehead.  This morning, he was still lethargic, was not answering questions.  He opened his eyes but was unable to talk.  She felt that he may have had some "shivering", which lasted a few seconds.  EMS was called and pt was brought to the hospital for eval.  Upon arrival to ED, he was noted to be febrile, hypertensive.  He was noted to have possible jerking movements and his head/eyes were deviated to the left.  He received Ativan with resolution of seizure.  He has been lethargic since then.  Daughter reports that he did not complain of any recent headache, new neck pain, dysuria, n/v/d or SOB.  He has been referred for admission.  Review of Systems:  Limited due to mental status.  Pertinent positives as per HPI.     Past Medical History  Diagnosis Date  . Diabetes mellitus   . Hypertension   . Hypercholesteremia   . Glaucoma   . Diverticulosis   . Prostatism   . Reflux   . Tremor   . Other general symptoms(780.99)   .  Other vitamin B12 deficiency anemia   . Essential and other specified forms of tremor   . Abnormality of gait   . Memory loss   . Hearing difficulty of both ears     Bilateral hearing aids  . Macular degeneration   . Pneumonia    Past Surgical History  Procedure Laterality Date  . Leg surgery    . Tonsillectomy    . Eye surgery     Social History:  reports that he quit smoking about 52 years ago. His smoking use included Cigarettes. He has never used smokeless tobacco. He reports that he does not drink alcohol or use illicit drugs.  Allergies  Allergen Reactions  . Ciprofloxacin   . Bactrim Other (See Comments)    Shaking, patient doesn't recall this allergy     Family History  Problem Relation Age of Onset  . Stroke Mother   . Heart failure Father   . Heart disease Brother   . Arthritis Brother     Prior to Admission medications   Medication Sig Start Date End Date Taking? Authorizing Provider  amLODipine (NORVASC) 5 MG tablet Take 1 tablet (5 mg total) by mouth daily. 06/20/15  Yes Meredeth Ide, MD  aspirin EC 81 MG tablet Take 81 mg by mouth daily.   Yes Historical Provider, MD  Calcium Carbonate-Vitamin D (CALCIUM 600+D) 600-400 MG-UNIT per tablet Take 1  tablet by mouth 2 (two) times daily.     Yes Historical Provider, MD  fexofenadine (ALLEGRA) 180 MG tablet Take 180 mg by mouth daily.    Yes Historical Provider, MD  Glucosamine HCl 1500 MG TABS Take 1,500 mg by mouth daily.   Yes Historical Provider, MD  metFORMIN (GLUCOPHAGE) 500 MG tablet TAKE 1 TABLET BY MOUTH TWICE DAILY WITH MEALS. 05/01/15  Yes Babs Sciara, MD  Multiple Vitamins-Minerals (VITEYES AREDS FORMULA PO) Take 1 tablet by mouth 2 (two) times daily.    Yes Historical Provider, MD  pravastatin (PRAVACHOL) 40 MG tablet Take 40 mg by mouth every evening.   Yes Historical Provider, MD  lisinopril (PRINIVIL,ZESTRIL) 40 MG tablet Take 0.5 tablets (20 mg total) by mouth every evening. Patient not taking:  Reported on 06/23/2015 06/20/15   Meredeth Ide, MD  senna (SENOKOT) 8.6 MG TABS Take 1 tablet by mouth daily as needed (constipation).     Historical Provider, MD  SIMBRINZA 1-0.2 % SUSP Place 1 drop into both eyes 2 (two) times daily. 08/21/14   Historical Provider, MD  vitamin B-12 (CYANOCOBALAMIN) 100 MCG tablet Take 100 mcg by mouth 2 (two) times daily.      Historical Provider, MD  vitamin C (ASCORBIC ACID) 500 MG tablet Take 500 mg by mouth daily.    Historical Provider, MD   Physical Exam: Filed Vitals:   06/23/15 1238 06/23/15 1245 06/23/15 1300 06/23/15 1301  BP:  162/102 163/99   Pulse:  99 99   Temp:      TempSrc:      Resp: 19   15  SpO2:  97% 97%     Wt Readings from Last 3 Encounters:  06/19/15 70.4 kg (155 lb 3.3 oz)  02/25/15 69.06 kg (152 lb 4 oz)  12/04/14 70.035 kg (154 lb 6.4 oz)    General: Lying in bed, no signs of distress. Eyes: PERRL, normal lids, irises & conjunctiva ENT: grossly normal hearing, lips & tongue Neck: no LAD, masses or thyromegaly Cardiovascular: RRR, no m/r/g. No LE edema. Telemetry: SR, no arrhythmias  Respiratory: CTA bilaterally, no w/r/r. Normal respiratory effort. Abdomen: soft, ntnd Skin: no rash or induration seen on limited exam Musculoskeletal: grossly normal tone BUE/BLE Psychiatric: Could not assess due to mental status. Neurologic:  Limited due to mental status.  No gross focal deficits.          Labs on Admission:  Basic Metabolic Panel:  Recent Labs Lab 06/23/15 0956 06/23/15 1021  NA 136 138  K 4.3 4.2  CL 98* 98*  CO2 28  --   GLUCOSE 206* 214*  BUN 24* 25*  CREATININE 1.24 1.10  CALCIUM 9.4  --    Liver Function Tests:  Recent Labs Lab 06/23/15 0956  AST 37  ALT 29  ALKPHOS 75  BILITOT 0.7  PROT 8.3*  ALBUMIN 3.9   No results for input(s): LIPASE, AMYLASE in the last 168 hours. No results for input(s): AMMONIA in the last 168 hours. CBC:  Recent Labs Lab 06/23/15 0956 06/23/15 1021  WBC  11.8*  --   NEUTROABS 9.0*  --   HGB 15.3 16.3  HCT 45.3 48.0  MCV 97.4  --   PLT 294  --    Cardiac Enzymes: No results for input(s): CKTOTAL, CKMB, CKMBINDEX, TROPONINI in the last 168 hours.  BNP (last 3 results) No results for input(s): BNP in the last 8760 hours.  ProBNP (last 3 results) No results for  input(s): PROBNP in the last 8760 hours.  CBG:  Recent Labs Lab 06/19/15 1632 06/19/15 2033 06/20/15 0808 06/20/15 1157 06/23/15 1002  GLUCAP 150* 167* 145* 167* 207*    Radiological Exams on Admission: Ct Head Wo Contrast  06/23/2015   CLINICAL DATA:  Altered mental status today with hypoxemia. History of hypertension. No known injury. Initial encounter.  EXAM: CT HEAD WITHOUT CONTRAST  TECHNIQUE: Contiguous axial images were obtained from the base of the skull through the vertex without intravenous contrast.  COMPARISON:  Head CT 03/11/2013.  MRI brain 06/16/2015.  FINDINGS: There is no evidence of acute intracranial hemorrhage, mass lesion, brain edema or extra-axial fluid collection. The ventricles and subarachnoid spaces are prominent but stable. There is no CT evidence of acute cortical infarction. There is stable mild chronic periventricular white matter disease. Intracranial vascular calcifications noted.  Mild ethmoid sinus mucosal thickening. The visualized paranasal sinuses, mastoid air cells and middle ears are otherwise clear. The calvarium is intact.  IMPRESSION: Stable examination demonstrating mild atrophy and chronic small vessel ischemic changes. No acute intracranial findings.   Electronically Signed   By: Carey Bullocks M.D.   On: 06/23/2015 11:08   Dg Chest Portable 1 View  06/23/2015   CLINICAL DATA:  Unresponsive this morning.  EXAM: PORTABLE CHEST - 1 VIEW  COMPARISON:  06/15/2015  FINDINGS: There are low lung volumes with crowding of the interstitial markings. There is no focal parenchymal opacity. There is no pleural effusion or pneumothorax. The heart  and mediastinal contours are unremarkable. There is thoracic aortic atherosclerosis.  There is calcification adjacent to right greater tuberosity concerning for mild calcific tendinosis.  IMPRESSION: No active disease.   Electronically Signed   By: Elige Ko   On: 06/23/2015 10:17    EKG: Independently reviewed. Poor quality tracing with significant artifact.  Appears to be regular.  Assessment/Plan Active Problems:   Seizure   Acute encephalopathy   1. Seizure.  Etiology unclear, although likely precipitated by fever.  He received a dose of Ativan.  Will hold off on starting anti-epileptics, check EEG.  Will consult neurology for further treatment evaluation and guidance. PT has had recent MRI of the brain.   2. Acute encephalopathy. Unclear etiology.  He has not been started on any new medications.  CXR/UA unremarkable.  Since pt presented with a seizure, would need to consider possible CNS infection.  Pt would need LP for further eval.  Family is unsure whether they wish to proceed with this.  They will discuss amongst themselves.  Will continue on broad spectrum abx.  Add acyclovir to cover for HSV encephalitis.  Await further input from Neuro.    3. DM Type 2 - Sliding scale insulin. 4. HTN. Currently stable.  Will need to use IV antihypertensives until he is more awake.   5. Acute respiratory failure with hypoxia.  Pt noted to be hypoxic on arrival.  Currently on non-rebreather.  CXR did not show acute findings.  Suspect hypoxia is related to seizure.  Will continue to monitor and wean down O2 as tolerated. 6. Dementia.  Daughter reported that he had increased confusion during his last hospitalization, which resolved when he returned home.  I anticipate that he will have increased confusion during his hospital stay.  Will continue to monitor.   7. Sepsis.  Pt admitted with fever and mild leukocytosis.  He was also initially tachypneic on arrival.  Blood cultures have been sent.  Lactic acid  is elevated.  Will continue on IVF per sepsis order set.  He is on broad-spectrum abx.   Code Status:  DNR DVT Prophylaxis: SCDs Family Communication: Daughter bedside.  Disposition Plan:  Admit for further evaluation   Time spent: 70 minutes   Erick Blinks, M.D. [facility] Pager [phone]  I, Lucretia Roers, acting a scribe, recorded this note contemporaneously in the presence of Dr. Erick Blinks, M.D. on 06/23/2015 at 1:57 PM.   I have reviewed the above documentation for accuracy and completeness, and I agree with the above.  MEMON,JEHANZEB

## 2015-06-23 NOTE — ED Provider Notes (Signed)
CSN: [id number]     Arrival date & time 06/23/15  0950 History  This chart was scribed for Linwood Dibbles, MD by Murriel Hopper, ED Scribe. This patient was seen in room APA02/APA02 and the patient's care was started at 9:51 AM.    Chief Complaint  Patient presents with  . Altered Mental Status      Patient is a 81 y.o. male presenting with altered mental status. The history is provided by a relative. The history is limited by the condition of the patient. No language interpreter was used.  Altered Mental Status Presenting symptoms: unresponsiveness   Severity:  Severe Episode history:  Multiple Timing:  Constant Chronicity:  Recurrent  HPI Comments: Bradley Reed is a 81 y.o. male who presents to the Emergency Department via EMS for being non-responsive at home this morning. His son states that last night when he went to bed he had no symptoms, but states he received a call this morning at 0730 and was told that the nurse who was called to pt home said he needed to be taken to ED because he was unresponsive, had low oxygen levels, and high blood pressure. His son notes a prior history of similar symptoms but denies hx of a seizure or tremors. His son also reaffirms that he is DNR and that his paperwork was brought in to hospital the last time he was seen here.    Past Medical History  Diagnosis Date  . Diabetes mellitus   . Hypertension   . Hypercholesteremia   . Glaucoma   . Diverticulosis   . Prostatism   . Reflux   . Tremor   . Other general symptoms(780.99)   . Other vitamin B12 deficiency anemia   . Essential and other specified forms of tremor   . Abnormality of gait   . Memory loss   . Hearing difficulty of both ears     Bilateral hearing aids  . Macular degeneration   . Pneumonia    Past Surgical History  Procedure Laterality Date  . Leg surgery    . Tonsillectomy    . Eye surgery     Family History  Problem Relation Age of Onset  . Stroke Mother   . Heart  failure Father   . Heart disease Brother   . Arthritis Brother    Social History  Substance Use Topics  . Smoking status: Former Smoker    Types: Cigarettes    Quit date: 03/17/1963  . Smokeless tobacco: Never Used  . Alcohol Use: No    Review of Systems  Unable to perform ROS: Acuity of condition      Allergies  Ciprofloxacin and Bactrim  Home Medications   Prior to Admission medications   Medication Sig Start Date End Date Taking? Authorizing Provider  amLODipine (NORVASC) 5 MG tablet Take 1 tablet (5 mg total) by mouth daily. 06/20/15  Yes Meredeth Ide, MD  aspirin EC 81 MG tablet Take 81 mg by mouth daily.   Yes Historical Provider, MD  Calcium Carbonate-Vitamin D (CALCIUM 600+D) 600-400 MG-UNIT per tablet Take 1 tablet by mouth 2 (two) times daily.     Yes Historical Provider, MD  fexofenadine (ALLEGRA) 180 MG tablet Take 180 mg by mouth daily.    Yes Historical Provider, MD  Glucosamine HCl 1500 MG TABS Take 1,500 mg by mouth daily.   Yes Historical Provider, MD  metFORMIN (GLUCOPHAGE) 500 MG tablet TAKE 1 TABLET BY MOUTH TWICE DAILY WITH MEALS.  05/01/15  Yes Babs Sciara, MD  Multiple Vitamins-Minerals (VITEYES AREDS FORMULA PO) Take 1 tablet by mouth 2 (two) times daily.    Yes Historical Provider, MD  pravastatin (PRAVACHOL) 40 MG tablet Take 40 mg by mouth every evening.   Yes Historical Provider, MD  lisinopril (PRINIVIL,ZESTRIL) 40 MG tablet Take 0.5 tablets (20 mg total) by mouth every evening. Patient not taking: Reported on 06/23/2015 06/20/15   Meredeth Ide, MD  senna (SENOKOT) 8.6 MG TABS Take 1 tablet by mouth daily as needed (constipation).     Historical Provider, MD  SIMBRINZA 1-0.2 % SUSP Place 1 drop into both eyes 2 (two) times daily. 08/21/14   Historical Provider, MD  vitamin B-12 (CYANOCOBALAMIN) 100 MCG tablet Take 100 mcg by mouth 2 (two) times daily.      Historical Provider, MD  vitamin C (ASCORBIC ACID) 500 MG tablet Take 500 mg by mouth daily.     Historical Provider, MD   BP 167/103 mmHg  Pulse 100  Temp(Src) 100.6 F (38.1 C) (Rectal)  Resp 29  SpO2 97% Physical Exam  Constitutional: He appears distressed.  HENT:  Head: Normocephalic and atraumatic.  Right Ear: External ear normal.  Left Ear: External ear normal.  Eyes: Conjunctivae are normal. Right eye exhibits no discharge. Left eye exhibits no discharge. No scleral icterus.  Eyes deviated to the left   Neck: Neck supple. No tracheal deviation present.  Cardiovascular: Tachycardia present.   Pulmonary/Chest: Effort normal. No stridor. No respiratory distress. He has no wheezes. He has no rales.  Abdominal: He exhibits no distension and no mass. There is no tenderness. There is no rebound and no guarding.  Musculoskeletal: He exhibits no edema or tenderness.  Neurological: He is unresponsive. Cranial nerve deficit: no gross deficits. He displays seizure activity.  Pt is having repetitive jerking of all limbs and body, head and eyes deviated to the left, does not follow commands  Skin: Skin is warm and dry. No rash noted.  Psychiatric: He has a normal mood and affect.  Nursing note and vitals reviewed.   ED Course  Procedures (including critical care time) CRITICAL CARE Performed by: ZOXWR,UEA Total critical care time: 35 Critical care time was exclusive of separately billable procedures and treating other patients. Critical care was necessary to treat or prevent imminent or life-threatening deterioration. Critical care was time spent personally by me on the following activities: development of treatment plan with patient and/or surrogate as well as nursing, discussions with consultants, evaluation of patient's response to treatment, examination of patient, obtaining history from patient or surrogate, ordering and performing treatments and interventions, ordering and review of laboratory studies, ordering and review of radiographic studies, pulse oximetry and  re-evaluation of patient's condition. DIAGNOSTIC STUDIES: Oxygen Saturation is 98% on , adequate by my interpretation.    COORDINATION OF CARE: 9:59 AM Discussed treatment plan with pt at bedside and pt agreed to plan.   Labs Review Labs Reviewed  CBC - Abnormal; Notable for the following:    WBC 11.8 (*)    All other components within normal limits  DIFFERENTIAL - Abnormal; Notable for the following:    Neutro Abs 9.0 (*)    Lymphocytes Relative 11 (*)    Monocytes Absolute 1.2 (*)    All other components within normal limits  COMPREHENSIVE METABOLIC PANEL - Abnormal; Notable for the following:    Chloride 98 (*)    Glucose, Bld 206 (*)    BUN 24 (*)  Total Protein 8.3 (*)    GFR calc non Af Amer 50 (*)    GFR calc Af Amer 58 (*)    All other components within normal limits  URINALYSIS, ROUTINE W REFLEX MICROSCOPIC (NOT AT [facility]) - Abnormal; Notable for the following:    Glucose, UA 100 (*)    Hgb urine dipstick SMALL (*)    Protein, ur 30 (*)    All other components within normal limits  URINE MICROSCOPIC-ADD ON - Abnormal; Notable for the following:    Bacteria, UA FEW (*)    All other components within normal limits  I-STAT CHEM 8, ED - Abnormal; Notable for the following:    Chloride 98 (*)    BUN 25 (*)    Glucose, Bld 214 (*)    All other components within normal limits  CBG MONITORING, ED - Abnormal; Notable for the following:    Glucose-Capillary 207 (*)    All other components within normal limits  I-STAT CG4 LACTIC ACID, ED - Abnormal; Notable for the following:    Lactic Acid, Venous 2.56 (*)    All other components within normal limits  CULTURE, BLOOD (ROUTINE X 2)  CULTURE, BLOOD (ROUTINE X 2)  ETHANOL  PROTIME-INR  APTT  URINE RAPID DRUG SCREEN, HOSP PERFORMED  I-STAT TROPOININ, ED    Imaging Review Ct Head Wo Contrast  06/23/2015   CLINICAL DATA:  Altered mental status today with hypoxemia. History of hypertension. No known injury. Initial  encounter.  EXAM: CT HEAD WITHOUT CONTRAST  TECHNIQUE: Contiguous axial images were obtained from the base of the skull through the vertex without intravenous contrast.  COMPARISON:  Head CT 03/11/2013.  MRI brain 06/16/2015.  FINDINGS: There is no evidence of acute intracranial hemorrhage, mass lesion, brain edema or extra-axial fluid collection. The ventricles and subarachnoid spaces are prominent but stable. There is no CT evidence of acute cortical infarction. There is stable mild chronic periventricular white matter disease. Intracranial vascular calcifications noted.  Mild ethmoid sinus mucosal thickening. The visualized paranasal sinuses, mastoid air cells and middle ears are otherwise clear. The calvarium is intact.  IMPRESSION: Stable examination demonstrating mild atrophy and chronic small vessel ischemic changes. No acute intracranial findings.   Electronically Signed   By: Carey Bullocks M.D.   On: 06/23/2015 11:08   Dg Chest Portable 1 View  06/23/2015   CLINICAL DATA:  Unresponsive this morning.  EXAM: PORTABLE CHEST - 1 VIEW  COMPARISON:  06/15/2015  FINDINGS: There are low lung volumes with crowding of the interstitial markings. There is no focal parenchymal opacity. There is no pleural effusion or pneumothorax. The heart and mediastinal contours are unremarkable. There is thoracic aortic atherosclerosis.  There is calcification adjacent to right greater tuberosity concerning for mild calcific tendinosis.  IMPRESSION: No active disease.   Electronically Signed   By: Elige Ko   On: 06/23/2015 10:17   I, Buzz Axel, personally reviewed and evaluated these images and lab results as part of my medical decision-making.   EKG Interpretation   Date/Time:  Monday June 23 2015 09:53:00 EDT Ventricular Rate:  126 PR Interval:    QRS Duration: 156 QT Interval:  412 QTC Calculation: 597 R Axis:   67 Text Interpretation:  Undetermined rhythm Ventricular premature complex  Right bundle  branch block Artifact in lead(s) II III aVL aVF V3 V4 V5 V6  Poor data quality Confirmed by Beatrix Breece  MD-J, Tristan Bramble ([id number]) on 06/23/2015  10:10:19 AM     1033  Pt's was initially htn 210/110.  Concerned about potential CNS bleed with his HTN and his seizure.  Labetalol IV ordered.  BP has dropped now significantly.  Will give fluid bolus.  Monitor.  Pt remains DNR, comfort and supportive measures per son.  1130  patient's blood pressure is hypertensive once again. Patient has not had any recurrent seizure activities. He remains minimally responsive. I have updated the family of the recent findings.  We'll start the patient empirically on antibodies considering the low grade temperature. However this very well may be related to the recent seizure. MDM   Final diagnoses:  Seizure    Patient presents to the emergency room with altered mental status and acute seizures noted in the emergency department. Patient does not have any history of seizure. Patient does not have any acute hemorrhage and a CT scan. Exact cause of this is unknown at this point. Patient is DO NOT RESUSCITATE. Family does not want aggressive measures.  Empiric abx started while cultures are pending.  Will consult with medical service for admission, further evaluation.  I personally performed the services described in this documentation, which was scribed in my presence.  The recorded information has been reviewed and is accurate.    Linwood Dibbles, MD 06/23/15 [phone]

## 2015-06-23 NOTE — Progress Notes (Signed)
ANTIBIOTIC CONSULT NOTE - INITIAL  Pharmacy Consult for vancomycin and zosyn Indication: rule out sepsis  Allergies  Allergen Reactions  . Ciprofloxacin   . Bactrim Other (See Comments)    Shaking, patient doesn't recall this allergy     Patient Measurements: Height: 5\' 9"  (175.3 cm) Weight: 143 lb 15.4 oz (65.3 kg) IBW/kg (Calculated) : 70.7  Vital Signs: Temp: 99.6 F (37.6 C) (08/15 1403) Temp Source: Axillary (08/15 1403) BP: 159/90 mmHg (08/15 1403) Pulse Rate: 100 (08/15 1403) Intake/Output from previous day:   Intake/Output from this shift:    Labs:  Recent Labs  06/23/15 0956 06/23/15 1021  WBC 11.8*  --   HGB 15.3 16.3  PLT 294  --   CREATININE 1.24 1.10   Estimated Creatinine Clearance: 42 mL/min (by C-G formula based on Cr of 1.1). No results for input(s): VANCOTROUGH, VANCOPEAK, VANCORANDOM, GENTTROUGH, GENTPEAK, GENTRANDOM, TOBRATROUGH, TOBRAPEAK, TOBRARND, AMIKACINPEAK, AMIKACINTROU, AMIKACIN in the last 72 hours.   Microbiology: Recent Results (from the past 720 hour(s))  Urine culture     Status: None   Collection Time: 06/15/15 11:44 PM  Result Value Ref Range Status   Specimen Description URINE, CLEAN CATCH  Final   Special Requests NONE  Final   Culture   Final    NO GROWTH 2 DAYS Performed at [facility]    Report Status 06/18/2015 FINAL  Final  Culture, blood (routine x 2) Call MD if unable to obtain prior to antibiotics being given     Status: None   Collection Time: 06/16/15  4:07 AM  Result Value Ref Range Status   Specimen Description BLOOD RIGHT ANTECUBITAL  Final   Special Requests BOTTLES DRAWN AEROBIC AND ANAEROBIC 6CC  Final   Culture NO GROWTH 5 DAYS  Final   Report Status 06/21/2015 FINAL  Final  Culture, blood (routine x 2) Call MD if unable to obtain prior to antibiotics being given     Status: None   Collection Time: 06/16/15  4:11 AM  Result Value Ref Range Status   Specimen Description BLOOD LEFT HAND  Final    Special Requests BOTTLES DRAWN AEROBIC AND ANAEROBIC 6CC BOTTLES  Final   Culture NO GROWTH 5 DAYS  Final   Report Status 06/21/2015 FINAL  Final    Medical History: Past Medical History  Diagnosis Date  . Diabetes mellitus   . Hypertension   . Hypercholesteremia   . Glaucoma   . Diverticulosis   . Prostatism   . Reflux   . Tremor   . Other general symptoms(780.99)   . Other vitamin B12 deficiency anemia   . Essential and other specified forms of tremor   . Abnormality of gait   . Memory loss   . Hearing difficulty of both ears     Bilateral hearing aids  . Macular degeneration   . Pneumonia     Medications:  Anti-infectives    Start     Dose/Rate Route Frequency Ordered Stop   06/23/15 2300  vancomycin (VANCOCIN) IVPB 750 mg/150 ml premix     750 mg 150 mL/hr over 60 Minutes Intravenous Every 12 hours 06/23/15 1407     06/23/15 2000  piperacillin-tazobactam (ZOSYN) IVPB 3.375 g     3.375 g 12.5 mL/hr over 240 Minutes Intravenous Every 8 hours 06/23/15 1407     06/23/15 1215  piperacillin-tazobactam (ZOSYN) IVPB 3.375 g     3.375 g 100 mL/hr over 30 Minutes Intravenous  Once 06/23/15 1205 06/23/15  1306   06/23/15 1215  vancomycin (VANCOCIN) IVPB 1000 mg/200 mL premix     1,000 mg 200 mL/hr over 60 Minutes Intravenous  Once 06/23/15 1205 06/23/15 1410      Assessment: 81 y.o. Male presenting to the ED after being found non-responsive at home with low oxygen levels and high blood pressure. Possible sepsis. Pt experienced seizures in the ED. Vancomycin and zosyn initiated pending cultures. Normalized CrCl is approximately 42 mL/min.  Goal of Therapy:  Vancomycin trough level 15-20 mcg/ml  Plan:  Zosyn 3.375g IV q8h, each dose over 4 hours Vancomycin  IV q12h Check trough at steady state Monitor labs, renal function, and c/s  Valrie Hart A 06/23/2015,2:09 PM

## 2015-06-23 NOTE — ED Notes (Signed)
Dr. Lynelle Doctor notified of last vital signs.  Orders received.   Continue to monitor pt closely.

## 2015-06-24 NOTE — Telephone Encounter (Signed)
Patient is at [facility]. Talked with Hospice nurse this morning and notified Dr. Brett Canales of patient's condition. Family is with the patient.

## 2015-06-28 LAB — CULTURE, BLOOD (ROUTINE X 2)
Culture: NO GROWTH
Culture: NO GROWTH

## 2015-07-10 DEATH — deceased

## 2015-08-26 ENCOUNTER — Ambulatory Visit: Payer: Medicare Other | Admitting: Family Medicine
